# Patient Record
Sex: Female | Born: 1985 | Race: White | Hispanic: No | Marital: Married | Smoking: Never smoker
Health system: Southern US, Community
[De-identification: ages and names within clinical notes are randomized; demographics above are authoritative.]

## PROBLEM LIST (undated history)

## (undated) DIAGNOSIS — F32A Depression, unspecified: Secondary | ICD-10-CM

## (undated) DIAGNOSIS — F329 Major depressive disorder, single episode, unspecified: Secondary | ICD-10-CM

## (undated) DIAGNOSIS — F419 Anxiety disorder, unspecified: Secondary | ICD-10-CM

## (undated) DIAGNOSIS — E079 Disorder of thyroid, unspecified: Secondary | ICD-10-CM

## (undated) DIAGNOSIS — L409 Psoriasis, unspecified: Secondary | ICD-10-CM

## (undated) HISTORY — DX: Anxiety disorder, unspecified: F41.9

## (undated) HISTORY — DX: Disorder of thyroid, unspecified: E07.9

## (undated) HISTORY — DX: Depression, unspecified: F32.A

## (undated) HISTORY — DX: Psoriasis, unspecified: L40.9

## (undated) HISTORY — PX: OTHER SURGICAL HISTORY: SHX169

---
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
## 2014-05-06 DIAGNOSIS — E079 Disorder of thyroid, unspecified: Secondary | ICD-10-CM

## 2014-05-06 HISTORY — DX: Disorder of thyroid, unspecified: E07.9

## 2015-05-11 DIAGNOSIS — H52223 Regular astigmatism, bilateral: Secondary | ICD-10-CM | POA: Diagnosis not present

## 2015-05-11 DIAGNOSIS — H5203 Hypermetropia, bilateral: Secondary | ICD-10-CM | POA: Diagnosis not present

## 2015-05-24 ENCOUNTER — Ambulatory Visit (HOSPITAL_COMMUNITY): Payer: Self-pay | Admitting: Psychiatry

## 2015-05-25 MED FILL — CLOBETASOL 0.05% OINTMENT: 0.05 | 30 days supply | Qty: 60 | Fill #0

## 2015-05-26 ENCOUNTER — Encounter (HOSPITAL_COMMUNITY): Payer: Self-pay | Admitting: Psychiatry

## 2015-05-26 ENCOUNTER — Ambulatory Visit (INDEPENDENT_AMBULATORY_CARE_PROVIDER_SITE_OTHER): Payer: 59 | Admitting: Psychiatry

## 2015-05-26 VITALS — BP 111/72 | HR 72 | Wt 116.0 lb

## 2015-05-26 DIAGNOSIS — F331 Major depressive disorder, recurrent, moderate: Secondary | ICD-10-CM

## 2015-05-26 DIAGNOSIS — O99345 Other mental disorders complicating the puerperium: Secondary | ICD-10-CM

## 2015-05-26 DIAGNOSIS — F53 Postpartum depression: Secondary | ICD-10-CM

## 2015-05-26 DIAGNOSIS — L409 Psoriasis, unspecified: Secondary | ICD-10-CM | POA: Insufficient documentation

## 2015-05-26 MED ORDER — SERTRALINE HCL 50 MG PO TABS: ORAL_TABLET | ORAL | Status: DC

## 2015-05-26 NOTE — Progress Notes (Signed)
Psychiatric Initial Adult Assessment   Patient Identification: Brittany Mcknight MRN:  [id number] Date of Evaluation:  05/26/2015 Referral Source: Primary care/ OBGYN Chief Complaint:   Chief Complaint    Establish Care     Visit Diagnosis:    ICD-9-CM ICD-10-CM   1. Major depressive disorder, recurrent episode, moderate (HCC) 296.32 F33.1 Thyroid Panel With TSH  2. Psoriasis 696.1 L40.9 Thyroid Panel With TSH  3. Postpartum depression 648.44 F53    311     Diagnosis:   Patient Active Problem List   Diagnosis Date Noted  . Psoriasis [L40.9] 05/26/2015   History of Present Illness:  30 years old currently married Caucasian female referred by primary care physician for management of depression.  Patient is currently 5 months postpartum she has seen her OB/GYN doctor  and was started Zoloft a few months ago. For follow-up she was referred to psychiatry. Says that she has been feeling down not interested in things a motivated. She was living at carrier with her parents at that time she was feeling more overwhelmed. Crying spells feeling blah disinterested and not enjoying the things. Says Zoloft has helped some but she still feels down but not having crying spells. She has moved with her husband now living in [facility]. She has to take care of 2 babies. 27 yr old boy and 30 months old daughter.  She also endorses worries, worries about her health. She has psoriasis. She also is worried about her finances and revocation but is trying to adjust. She says that she does not have too many friends away her. Had good bonding with baby. Baby was calm. Toddler was in play room did well with staff.  Aggravating factors; finances. He location from [place]. Postpartum Modifying factors; supportive husband he is now working. Severity of depression; 5 out of 10. 10 being no depression Context; postpartum and finances Duration 6-7 months  Associated Signs/Symptoms: Depression Symptoms:  depressed  mood, anhedonia, fatigue, anxiety, (Hypo) Manic Symptoms:  Distractibility, Anxiety Symptoms:  Excessive Worry, Psychotic Symptoms:  denies PTSD Symptoms: NA   Past Psychiatric History: No prior psychiatric admission or suicide attempt. Says that then she has been depressed when she was younger but never had any regular treatment. Did not endorse post partum 3 years ago with her first baby. Says that she kept humble with her relegion and giving, maybe that indirectly helped.  Denies prior Mania or psychotic episode. Denies prior drug use or alcohol abuse. Past Medical History:  Past Medical History  Diagnosis Date  . Psoriasis    History reviewed. No pertinent past surgical history. Family History:  Family History  Problem Relation Age of Onset  . Depression Father    Social History:   Social History   Social History  . Marital Status: Married    Spouse Name: N/A  . Number of Children: N/A  . Years of Education: N/A   Social History Main Topics  . Smoking status: Never Smoker   . Smokeless tobacco: Never Used  . Alcohol Use: No  . Drug Use: No  . Sexual Activity: Yes   Other Topics Concern  . None   Social History Narrative  . None   Additional Social History: Grew up in [place] and [place]. Follow-up with her parents and 4 siblings. 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 Currently married for the last 5 years supportive husband. 2 babies   Musculoskeletal: Strength & Muscle  Tone: within normal limits Gait & Station: normal Patient leans: N/A  Psychiatric Specialty Exam: HPI  Review of Systems  Constitutional: Negative.   Respiratory: Negative for cough.   Cardiovascular: Negative for chest pain.  Skin: Negative for rash.  Neurological: Negative for tremors.  Psychiatric/Behavioral: Positive for depression. Negative for suicidal ideas and substance abuse.    Blood pressure 111/72, pulse 72, weight 116  lb (52.617 kg).There is no height on file to calculate BMI.  General Appearance: Casual  Eye Contact:  Fair  Speech:  Normal Rate  Volume:  Normal  Mood:  Dysphoric  Affect:  Congruent and Constricted  Thought Process:  Coherent  Orientation:  Full (Time, Place, and Person)  Thought Content:  Rumination  Suicidal Thoughts:  No  Homicidal Thoughts:  No  Memory:  Immediate;   Fair Recent;   Fair  Judgement:  Fair  Insight:  Shallow  Psychomotor Activity:  Normal  Concentration:  Fair  Recall:  [place]: Fair  Akathisia:  Negative  Handed:  Right  AIMS (if indicated):    Assets:  Communication Skills Desire for Improvement Social Support  ADL's:  Intact  Cognition: WNL  Sleep:  fair   Is the patient at risk to self?  No. Has the patient been a risk to self in the past 6 months?  No. Has the patient been a risk to self within the distant past?  No. Is the patient a risk to others?  No. Has the patient been a risk to others in the past 6 months?  No. Has the patient been a risk to others within the distant past?  No.  Allergies:   Allergies  Allergen Reactions  . Imitrex [Sumatriptan]    Current Medications: Current Outpatient Prescriptions  Medication Sig Dispense Refill  . NORA-BE 0.35 MG tablet TK 1 T PO  QD  2  . sertraline (ZOLOFT) 50 MG tablet Take one and half. Total dose of 75mg  45 tablet 0   No current facility-administered medications for this visit.    Previous Psychotropic Medications: No   Substance Abuse History in the last 12 months:  No.  Consequences of Substance Abuse: NA  Medical Decision Making:  Review of Psycho-Social Stressors (1), Review or order clinical lab tests (1), Decision to obtain old records (1) and Review of Medication Regimen & Side Effects (2)  Treatment Plan Summary: Medication management and Plan as follows  MDD: Increase Zoloft to 75 mg discussed the side effects she does have nausea  sometime understands that she has to maybe take it with food or divide the dose Post partum: as above treatment. No psychotic symptoms. Has good bonding with baby Rule out hypothryoidism: says TSH was done in past . Had to repeat it. Will write down for panel. More than 50% time spent in counseling and coordination of care including patient education Call 911 or report local emergency room for any urgent concerns or suicidal thoughts Consider therapy but as of now she wants to continue medication management  Follow-up in 3-4 weeks or earlier if needed  Montavius Subramaniam 1/20/20173:08 PM

## 2015-05-27 LAB — THYROID PANEL WITH TSH
Free Thyroxine Index: 1.6 (ref 1.4–3.8)
T3 Uptake: 34 % (ref 22–35)
T4 TOTAL: 4.6 ug/dL (ref 4.5–12.0)
TSH: 1.749 u[IU]/mL (ref 0.350–4.500)

## 2015-06-05 ENCOUNTER — Encounter: Payer: Self-pay | Admitting: Osteopathic Medicine

## 2015-06-05 ENCOUNTER — Ambulatory Visit (INDEPENDENT_AMBULATORY_CARE_PROVIDER_SITE_OTHER): Payer: 59 | Admitting: Osteopathic Medicine

## 2015-06-05 VITALS — BP 121/53 | HR 59 | Ht 64.0 in | Wt 122.0 lb

## 2015-06-05 DIAGNOSIS — R946 Abnormal results of thyroid function studies: Secondary | ICD-10-CM | POA: Diagnosis not present

## 2015-06-05 DIAGNOSIS — R7989 Other specified abnormal findings of blood chemistry: Secondary | ICD-10-CM

## 2015-06-05 NOTE — Progress Notes (Signed)
HPI: Brittany Mcknight is a 30 y.o. female who presents to [place] today for chief complaint of:  Chief Complaint  Patient presents with  . Establish Care    THYROID CONCERNS     Thyroid: Was told thyroid was "off" by another Dr in [place], [place] at 6 weeks postpartum, no history of thyroid problems. No other problems in her pregnancy. Patient denies symptoms such as hair skin changes, significant fatigue, palpitations, diarrhea or constipation, heat or cold intolerance.  Past medical, social and family history reviewed: Past Medical History  Diagnosis Date  . Psoriasis   . Thyroid disease 2016   History reviewed. No pertinent past surgical history. Social History  Substance Use Topics  . Smoking status: Former Research scientist (life sciences)  . Smokeless tobacco: Never Used     Comment: Patient quit 2005  . Alcohol Use: No   Family History  Problem Relation Age of Onset  . Depression Father   . Cancer Mother     PANCREATIC  . Diabetes Maternal Grandfather   . Stroke Maternal Grandfather   . Diabetes Paternal Grandfather     Current Outpatient Prescriptions  Medication Sig Dispense Refill  . Clobetasol Propionate 0.05 % lotion Apply topically as needed.    Marland Kitchen NORA-BE 0.35 MG tablet TK 1 T PO  QD  2  . sertraline (ZOLOFT) 50 MG tablet Take one and half. Total dose of 75mg  45 tablet 0   No current facility-administered medications for this visit.   Allergies  Allergen Reactions  . Imitrex [Sumatriptan]       Review of Systems: CONSTITUTIONAL:  No  fever, no chills, No  unintentional weight changes HEAD/EYES/EARS/NOSE/THROAT: No  headache, no vision change, no hearing change, No  sore throat, No  sinus pressure CARDIAC: No  chest pain, No  pressure, No palpitations, No  orthopnea RESPIRATORY: No  cough, No  shortness of breath/wheeze GASTROINTESTINAL: No  nausea, No  vomiting, No  abdominal pain, No  blood in stool, No  diarrhea, No  constipation   MUSCULOSKELETAL: No  myalgia/arthralgia GENITOURINARY: No  incontinence, No  abnormal genital bleeding/discharge SKIN: No  rash/wounds/concerning lesions HEM/ONC: No  easy bruising/bleeding, No  abnormal lymph node ENDOCRINE: No polyuria/polydipsia/polyphagia, No  heat/cold intolerance  NEUROLOGIC: No  weakness, No  dizziness, No  slurred speech PSYCHIATRIC: No  concerns with depression, No  concerns with anxiety, No sleep problems  Exam:  BP 121/53 mmHg  Pulse 59  Ht 5\' 4"  (1.626 m)  Wt 122 lb (55.339 kg)  BMI 20.93 kg/m2 Constitutional: VS see above. General Appearance: alert, well-developed, well-nourished, NAD Eyes: Normal lids and conjunctive, non-icteric sclera Ears, Nose, Mouth, Throat: MMM, Normal external inspection ears/nares/mouth/lips/gums,   Neck: No masses, trachea midline. No thyroid enlargement/tenderness/mass appreciated. No lymphadenopathy Respiratory: Normal respiratory effort. no wheeze, no rhonchi, no rales Cardiovascular: S1/S2 normal, no murmur, no rub/gallop auscultated. RRR.  Skin: warm, dry, intact. No rash/ulcer. No concerning nevi or subq nodules on limited exam.   Psychiatric: Normal judgment/insight. Normal mood and affect. Oriented x3.    No results found for this or any previous visit (from the past 72 hour(s)).    ASSESSMENT/PLAN: Patient reports abnormal thyroid blood test however most recent lab draw done by psychiatry shows normal thyroid function, question transient thyroid abnormality postpartum, await labs from previous physicians office. Can recheck in 6 months. Otherwise follow-up for annual wellness exam.   Abnormal thyroid blood test   Return in about 6 months (around 12/03/2015) for  recheck labs.

## 2015-06-05 NOTE — Patient Instructions (Signed)
We will get lab results from previous doctor, otherwise follow up thyroid test in 6 months, please schedule visit sooner if any problems or concerns.

## 2015-06-12 MED FILL — NORETHINDRONE 0.35 MG TAB: 0.35 | 28 days supply | Qty: 28 | Fill #0

## 2015-06-12 MED FILL — SERTRALINE HCL 50 MG TABLET: 50 | 30 days supply | Qty: 30 | Fill #0

## 2015-06-13 DIAGNOSIS — L409 Psoriasis, unspecified: Secondary | ICD-10-CM | POA: Diagnosis not present

## 2015-06-13 MED FILL — CALCIPOTRIENE 0.005% CREAM: 0.005 | 20 days supply | Qty: 60 | Fill #0

## 2015-06-13 MED FILL — CLOBETASOL 0.05% OINTMENT: 0.05 | 10 days supply | Qty: 30 | Fill #0

## 2015-06-14 ENCOUNTER — Ambulatory Visit (HOSPITAL_COMMUNITY): Payer: Self-pay | Admitting: Psychiatry

## 2015-06-22 ENCOUNTER — Encounter (HOSPITAL_COMMUNITY): Payer: Self-pay | Admitting: Psychiatry

## 2015-06-22 ENCOUNTER — Ambulatory Visit (INDEPENDENT_AMBULATORY_CARE_PROVIDER_SITE_OTHER): Payer: 59 | Admitting: Psychiatry

## 2015-06-22 VITALS — BP 122/62 | HR 71 | Ht 64.0 in | Wt 119.0 lb

## 2015-06-22 DIAGNOSIS — L409 Psoriasis, unspecified: Secondary | ICD-10-CM | POA: Diagnosis not present

## 2015-06-22 DIAGNOSIS — O99345 Other mental disorders complicating the puerperium: Secondary | ICD-10-CM

## 2015-06-22 DIAGNOSIS — F53 Postpartum depression: Secondary | ICD-10-CM

## 2015-06-22 DIAGNOSIS — F331 Major depressive disorder, recurrent, moderate: Secondary | ICD-10-CM | POA: Diagnosis not present

## 2015-06-22 NOTE — Progress Notes (Signed)
Patient ID: Brittany Mcknight, female   DOB: Sep 12, 1985, 30 y.o.   MRN: [id number]  [facility] Outpatient Follow up visit  Patient Identification: Brittany Mcknight MRN:  [id number] Date of Evaluation:  06/22/2015 Referral Source: Primary care/ OBGYN Chief Complaint:   Chief Complaint    Follow-up     Visit Diagnosis:    ICD-9-CM ICD-10-CM   1. Major depressive disorder, recurrent episode, moderate (HCC) 296.32 F33.1   2. Postpartum depression 648.44 F53    311    3. Psoriasis 696.1 L40.9    Diagnosis:   Patient Active Problem List   Diagnosis Date Noted  . Psoriasis [L40.9] 05/26/2015   History of Present Illness:  30 years old currently married Caucasian female initally referred by primary care physician for management of depression.  Patient was initially referred because of depression, crying spells she was postpartum months. Last visit we increased Zoloft to 75 mg she's been doing somewhat better Baby was here at good bonding. Her toddler was also here. Finances still a concern since she is not working. They're planning to move to a better more affordable living No nausea reported today tolerating medications.  Aggravating factors; finances. Her location from [place]. Postpartum Modifying factors; supportive husband he is now working. Severity of depression; 6 out of 10. 10 being no depression Context; postpartum and finances Duration 6-7 months  Associated Signs/Symptoms: Depression Symptoms: some dysphoria at times (Hypo) Manic Symptoms:  Distractibility, Anxiety Symptoms:  Excessive Worry, (improving) Psychotic Symptoms:  denies PTSD Symptoms: NA   Past Psychiatric History: No prior psychiatric admission or suicide attempt. Says that then she has been depressed when she was younger but never had any regular treatment.  Past Medical History:  Past Medical History  Diagnosis Date  . Psoriasis   . Thyroid disease 2016   History reviewed. No pertinent past surgical  history. Family History:  Family History  Problem Relation Age of Onset  . Depression Father   . Cancer Mother     PANCREATIC  . Diabetes Maternal Grandfather   . Stroke Maternal Grandfather   . Diabetes Paternal Grandfather    Social History:   Social History   Social History  . Marital Status: Married    Spouse Name: N/A  . Number of Children: N/A  . Years of Education: N/A   Social History Main Topics  . Smoking status: Former Research scientist (life sciences)  . Smokeless tobacco: Never Used     Comment: Patient quit 2005  . Alcohol Use: No  . Drug Use: No  . Sexual Activity: Yes   Other Topics Concern  . None   Social History Narrative      Musculoskeletal: Strength & Muscle Tone: within normal limits Gait & Station: normal Patient leans: N/A  Psychiatric Specialty Exam: HPI  Review of Systems  Constitutional: Negative for fever.  Respiratory: Negative for cough.   Cardiovascular: Negative for chest pain.  Gastrointestinal: Negative for nausea.  Skin: Negative for rash.  Neurological: Negative for tremors.  Psychiatric/Behavioral: Negative for suicidal ideas and substance abuse.    Blood pressure 122/62, pulse 71, height 5\' 4"  (1.626 m), weight 119 lb (53.978 kg), SpO2 98 %.Body mass index is 20.42 kg/(m^2).  General Appearance: Casual  Eye Contact:  Fair  Speech:  Normal Rate  Volume:  Normal  Mood:  Less dysphoric  Affect:  Reactive today  Thought Process:  Coherent  Orientation:  Full (Time, Place, and Person)  Thought Content:  Rumination  Suicidal Thoughts:  No  Homicidal  Thoughts:  No  Memory:  Immediate;   Fair Recent;   Fair  Judgement:  Fair  Insight:  Shallow  Psychomotor Activity:  Normal  Concentration:  Fair  Recall:  [place]: Fair  Akathisia:  Negative  Handed:  Right  AIMS (if indicated):    Assets:  Communication Skills Desire for Improvement Social Support  ADL's:  Intact  Cognition: WNL  Sleep:  fair   Is  the patient at risk to self?  No. Has the patient been a risk to self in the past 6 months?  No. Has the patient been a risk to self within the distant past?  No.   Allergies:   Allergies  Allergen Reactions  . Imitrex [Sumatriptan]    Current Medications: Current Outpatient Prescriptions  Medication Sig Dispense Refill  . Clobetasol Propionate 0.05 % lotion Apply topically as needed.    Marland Kitchen NORA-BE 0.35 MG tablet TK 1 T PO  QD  2  . sertraline (ZOLOFT) 50 MG tablet Take one and half. Total dose of 75mg  45 tablet 0   No current facility-administered medications for this visit.    Previous Psychotropic Medications: No   Substance Abuse History in the last 12 months:  No.  Consequences of Substance Abuse: NA   Treatment Plan Summary: Medication management and Plan as follows  MDD: Continue Zoloft to 75 mg discussed the side effects . No nausea reported today. Does not want to increase dose further. Post partum: as above treatment. No psychotic symptoms. Has good bonding with baby. Depression is improved Thyroid labs reviewed. Normal Insomnia: variable . Not worsened. More than 50% time spent in counseling and coordination of care including patient education Call 911 or report local emergency room for any urgent concerns or suicidal thoughts Consider therapy but as of now she wants to continue medication management Denies alcohol or drug use.  Follow-up in 8 weeks or earlier if needed Time spent: 25 minutes Araceli Coufal 2/16/201710:11 AM

## 2015-07-17 MED FILL — NORETHINDRONE 0.35 MG TAB: 0.35 | 28 days supply | Qty: 28 | Fill #0

## 2015-08-02 DIAGNOSIS — L409 Psoriasis, unspecified: Secondary | ICD-10-CM | POA: Diagnosis not present

## 2015-08-02 MED FILL — FLUOCINONIDE 0.05% SOLUTION: 0.05 | 30 days supply | Qty: 60 | Fill #0

## 2015-08-02 MED FILL — CALCIPOTRIENE 0.005% CREAM: 0.005 | 20 days supply | Qty: 60 | Fill #1

## 2015-08-02 MED FILL — CLOBETASOL 0.05% OINTMENT: 0.05 | 10 days supply | Qty: 30 | Fill #1

## 2015-08-08 ENCOUNTER — Encounter: Payer: Self-pay | Admitting: Osteopathic Medicine

## 2015-08-08 ENCOUNTER — Ambulatory Visit (INDEPENDENT_AMBULATORY_CARE_PROVIDER_SITE_OTHER): Payer: 59 | Admitting: Osteopathic Medicine

## 2015-08-08 VITALS — BP 122/63 | HR 102 | Wt 120.0 lb

## 2015-08-08 DIAGNOSIS — G4700 Insomnia, unspecified: Secondary | ICD-10-CM | POA: Diagnosis not present

## 2015-08-08 DIAGNOSIS — Z3041 Encounter for surveillance of contraceptive pills: Secondary | ICD-10-CM | POA: Diagnosis not present

## 2015-08-08 MED ORDER — NORA-BE 0.35 MG PO TABS: 1.0000 | ORAL_TABLET | Freq: Every day | ORAL | Status: DC

## 2015-08-08 MED ORDER — HYDROXYZINE HCL 50 MG PO TABS: 50.0000 mg | ORAL_TABLET | Freq: Every evening | ORAL | Status: DC | PRN

## 2015-08-08 MED FILL — NORETHINDRONE 0.35 MG TAB: 0.35 | 84 days supply | Qty: 84 | Fill #0

## 2015-08-08 NOTE — Progress Notes (Signed)
HPI: Brittany Mcknight is a 30 y.o. female who presents to [place] today for chief complaint of:  Chief Complaint  Patient presents with  . Contraception     . Patient seeking refill of birth control pills, apparently she passed her pharmacy these could be refilled, I think what happened was her previous physician denied these and says she needed an appointment, she came here for an appointment thinking it was necessary to see me. She has not run out of the birth control pills at this point  . No other problems today other than complaint of insomnia, she is being seen by psychiatry for treatment of anxiety problems. Doing well on Zoloft except some sleep disturbance.   Past medical, social and family history reviewed: Past Medical History  Diagnosis Date  . Psoriasis   . Thyroid disease 2016   No past surgical history on file. Social History  Substance Use Topics  . Smoking status: Former Research scientist (life sciences)  . Smokeless tobacco: Never Used     Comment: Patient quit 2005  . Alcohol Use: No   Family History  Problem Relation Age of Onset  . Depression Father   . Cancer Mother     PANCREATIC  . Diabetes Maternal Grandfather   . Stroke Maternal Grandfather   . Diabetes Paternal Grandfather     Current Outpatient Prescriptions  Medication Sig Dispense Refill  . Clobetasol Propionate 0.05 % lotion Apply topically as needed.    Marland Kitchen NORA-BE 0.35 MG tablet TK 1 T PO  QD  2  . sertraline (ZOLOFT) 50 MG tablet Take one and half. Total dose of 75mg  45 tablet 0   No current facility-administered medications for this visit.   Allergies  Allergen Reactions  . Imitrex [Sumatriptan]       Review of Systems: HEAD/EYES/EARS/NOSE/THROAT: No  headache,  CARDIAC: No  chest pain, GASTROINTESTINAL: No  nausea, No  vomiting, No  abdominal pai PSYCHIATRIC: No  concerns with depression, (+)concerns with anxiety, (+)sleep problems  Exam:  BP 122/63 mmHg  Pulse  102  Wt 120 lb (54.432 kg) Constitutional: VS see above. General Appearance: alert, well-developed, well-nourished, NAD Eyes: Normal lids and conjunctive, non-icteric sclera,  Neck: No masses, trachea midline.  Respiratory: Normal respiratory effort Musculoskeletal: Gait normal.  Psychiatric: Normal judgment/insight. Normal mood and affect. Oriented x3.    ASSESSMENT/PLAN:  Insomnia - Plan: hydrOXYzine (ATARAX/VISTARIL) 50 MG tablet  Encounter for surveillance of contraceptive pills - Plan: NORA-BE 0.35 MG tablet     Return as needed/annual physical.

## 2015-08-11 ENCOUNTER — Ambulatory Visit (HOSPITAL_COMMUNITY): Payer: 59 | Admitting: Psychiatry

## 2015-08-15 ENCOUNTER — Telehealth: Payer: Self-pay

## 2015-08-15 NOTE — Telephone Encounter (Signed)
Difficult to diagnose over the phone, though usually spotting/unusual bleeding is not serious and can happen in women who have been on birth control for some time. Please check that her birth control medication is the same (I know we refilled birth control recently which was initially Rx by another provider elsewhere, didn't make any changes but would double-check anyway that Rx is same). Sometimes abnormal bleeding/spotting can happen even if meds haven't changed. I would want to rule out pregnancy because sometimes early pregnancy can cause bleeding or spotting, and if bleeding persists we need to do a vaginal exam in the office to make sure no polyps or other visible anatomic abnormality and possible check some labs.

## 2015-08-16 ENCOUNTER — Ambulatory Visit (INDEPENDENT_AMBULATORY_CARE_PROVIDER_SITE_OTHER): Payer: 59 | Admitting: Osteopathic Medicine

## 2015-08-16 ENCOUNTER — Encounter: Payer: Self-pay | Admitting: Osteopathic Medicine

## 2015-08-16 VITALS — BP 110/72 | HR 80 | Wt 121.0 lb

## 2015-08-16 DIAGNOSIS — N926 Irregular menstruation, unspecified: Secondary | ICD-10-CM | POA: Diagnosis not present

## 2015-08-16 LAB — POCT URINE PREGNANCY: Preg Test, Ur: NEGATIVE

## 2015-08-16 MED ORDER — ESTRADIOL 1 MG PO TABS: 2.0000 mg | ORAL_TABLET | Freq: Every day | ORAL | Status: DC

## 2015-08-16 MED FILL — ESTRADIOL 1 MG TABLET: 1 | 7 days supply | Qty: 14 | Fill #0

## 2015-08-16 NOTE — Telephone Encounter (Signed)
Patient advised of results and recommendations.  

## 2015-08-16 NOTE — Progress Notes (Signed)
HPI: Brittany Mcknight is a 30 y.o. female who presents to [place] today for chief complaint of:  Chief Complaint  Patient presents with  . abnormal period    started period yesterday after having been on it the week before    . Quality: vaginal bleeding off schedule . Duration: 1 day . Timing: last period stopped 08/08/15, this one started yesterday 08/15/15 . Context: recently refilled birth  Control - same as what she was taking prior . Assoc signs/symptoms: some cramping, no passing clots or unusual discharge   Past medical, social and family history reviewed: Past Medical History  Diagnosis Date  . Psoriasis   . Thyroid disease 2016   No past surgical history on file. Social History  Substance Use Topics  . Smoking status: Former Research scientist (life sciences)  . Smokeless tobacco: Never Used     Comment: Patient quit 2005  . Alcohol Use: No   Family History  Problem Relation Age of Onset  . Depression Father   . Cancer Mother     PANCREATIC  . Diabetes Maternal Grandfather   . Stroke Maternal Grandfather   . Diabetes Paternal Grandfather     Current Outpatient Prescriptions  Medication Sig Dispense Refill  . Clobetasol Propionate 0.05 % lotion Apply topically as needed.    . hydrOXYzine (ATARAX/VISTARIL) 50 MG tablet Take 1 tablet (50 mg total) by mouth at bedtime as needed (SLEEP). 30 tablet 1  . NORA-BE 0.35 MG tablet Take 1 tablet (0.35 mg total) by mouth daily. 3 Package 4  . sertraline (ZOLOFT) 50 MG tablet Take one and half. Total dose of 75mg  45 tablet 0   No current facility-administered medications for this visit.   Allergies  Allergen Reactions  . Imitrex [Sumatriptan]       Review of Systems: CONSTITUTIONAL:  No  fever, no chills, No  unintentional weight changes GASTROINTESTINAL: No  nausea, No  vomiting, No  abdominal pain GENITOURINARY: No  incontinence, (+) abnormal genital bleeding as per hpi, no abnormal discharge SKIN: No   rash/wounds/concerning lesions HEM/ONC: No  easy bruising/bleeding,  Exam:  BP 110/72 mmHg  Pulse 80  Wt 121 lb (54.885 kg) Constitutional: VS see above. General Appearance: alert, well-developed, well-nourished, NAD Psychiatric: Normal judgment/insight. Normal mood and affect. Oriented x3.    No results found for this or any previous visit (from the past 72 hour(s)).    ASSESSMENT/PLAN: Trial estrogen burst to stop bleeding, advised can continue OCP or consider switching to other pill or other method but abnormal bleeding is not uncommon on OCP. ER/RTC precautions reviewed. Would consider further eval w/ vaginal exam, [place], labs or refer to GYN if no improvement  Irregular periods/menstrual cycles - Plan: POCT urine pregnancy, estradiol (ESTRACE) 1 MG tablet   Return if symptoms worsen or fail to improve.

## 2015-08-16 NOTE — Patient Instructions (Signed)
Irregular bleeding patterns are common in people on birth control and are usually not a cause for concern as long as the bleeding stops within a weeks or two, as long as pregnancy is ruled out and the bleeding is not profuse and there is no serious pain. Short course of estrogen will usually stop the bleeding if needed and many women do not have problems. Please let us know if your bleeding persists or worsens, or if you have any other concerns. We may need to do a vaginal exam and further labs/testing if needed.

## 2015-08-16 NOTE — Progress Notes (Signed)
Results for orders placed or performed in visit on 08/16/15 (from the past 24 hour(s))  POCT urine pregnancy     Status: Abnormal   Collection Time: 08/16/15  2:52 PM  Result Value Ref Range   Preg Test, Ur Negative Negative

## 2015-08-17 MED FILL — SERTRALINE HCL 50 MG TABLET: 50 | 30 days supply | Qty: 30 | Fill #1

## 2015-08-30 ENCOUNTER — Encounter (HOSPITAL_COMMUNITY): Payer: Self-pay | Admitting: Psychiatry

## 2015-08-30 ENCOUNTER — Ambulatory Visit (INDEPENDENT_AMBULATORY_CARE_PROVIDER_SITE_OTHER): Payer: 59 | Admitting: Psychiatry

## 2015-08-30 VITALS — BP 102/64 | HR 60 | Ht 64.0 in | Wt 123.0 lb

## 2015-08-30 DIAGNOSIS — F331 Major depressive disorder, recurrent, moderate: Secondary | ICD-10-CM | POA: Diagnosis not present

## 2015-08-30 DIAGNOSIS — F53 Postpartum depression: Secondary | ICD-10-CM

## 2015-08-30 DIAGNOSIS — O99345 Other mental disorders complicating the puerperium: Secondary | ICD-10-CM

## 2015-08-30 MED ORDER — SERTRALINE HCL 50 MG PO TABS: ORAL_TABLET | ORAL | Status: DC

## 2015-08-30 NOTE — Progress Notes (Signed)
Patient ID: Brittany Mcknight, female   DOB: 1985-12-24, 30 y.o.   MRN: [id number]  [facility] Outpatient Follow up visit  Patient Identification: Brittany Mcknight MRN:  [id number] Date of Evaluation:  08/30/2015 Referral Source: Primary care/ OBGYN Chief Complaint:   Chief Complaint    Follow-up     Visit Diagnosis:    ICD-9-CM ICD-10-CM   1. Major depressive disorder, recurrent episode, moderate (HCC) 296.32 F33.1   2. Postpartum depression 648.44 F53    311     Diagnosis:   Patient Active Problem List   Diagnosis Date Noted  . Psoriasis [L40.9] 05/26/2015   History of Present Illness:  30 years old currently married Caucasian female initally referred by primary care physician for management of depression.  Patient was initially referred because of depression, crying spells she was postpartum months. Last visit we kept zoloft at 75 mg as she was doing better in her depression.  Baby was here at good bonding. Her toddler here as well. Finances still a concern since she is not working.  No nausea reported today tolerating medications.  Aggravating factors; finances. Her location from [place]. Postpartum Modifying factors; supportive husband he is now working. Severity of depression; 7  out of 10. 10 being no depression Context; postpartum and finances Duration 8 months  Associated Signs/Symptoms: Depression Symptoms: some dysphoria at times( not worsened) (Hypo) Manic Symptoms:  Distractibility, Anxiety Symptoms:  Excessive Worry, (improving) Psychotic Symptoms:  denies PTSD Symptoms: NA    Past Medical History:  Past Medical History  Diagnosis Date  . Psoriasis   . Thyroid disease 2016   History reviewed. No pertinent past surgical history. Family History:  Family History  Problem Relation Age of Onset  . Depression Father   . Cancer Mother     PANCREATIC  . Diabetes Maternal Grandfather   . Stroke Maternal Grandfather   . Diabetes Paternal Grandfather    Social  History:   Social History   Social History  . Marital Status: Married    Spouse Name: N/A  . Number of Children: N/A  . Years of Education: N/A   Social History Main Topics  . Smoking status: Former Research scientist (life sciences)  . Smokeless tobacco: Never Used     Comment: Patient quit 2005  . Alcohol Use: No  . Drug Use: No  . Sexual Activity: Yes   Other Topics Concern  . None   Social History Narrative      Musculoskeletal: Strength & Muscle Tone: within normal limits Gait & Station: normal Patient leans: N/A  Psychiatric Specialty Exam: HPI  Review of Systems  Constitutional: Negative for fever.  Respiratory: Negative for cough.   Cardiovascular: Negative for chest pain.  Gastrointestinal: Negative for nausea.  Skin: Negative for rash.  Neurological: Negative for tremors.  Psychiatric/Behavioral: Negative for depression, suicidal ideas and substance abuse.    Blood pressure 102/64, pulse 60, height 5\' 4"  (1.626 m), weight 123 lb (55.792 kg), SpO2 99 %.Body mass index is 21.1 kg/(m^2).  General Appearance: Casual  Eye Contact:  Fair  Speech:  Normal Rate  Volume:  Normal  Mood:  euthymic  Affect:  Reactive  Thought Process:  Coherent  Orientation:  Full (Time, Place, and Person)  Thought Content:  normal  Suicidal Thoughts:  No  Homicidal Thoughts:  No  Memory:  Immediate;   Fair Recent;   Fair  Judgement:  Fair  Insight:  Shallow  Psychomotor Activity:  Normal  Concentration:  Fair  Recall:  [place]: Fair  Akathisia:  Negative  Handed:  Right  AIMS (if indicated):    Assets:  Communication Skills Desire for Improvement Social Support  ADL's:  Intact  Cognition: WNL  Sleep:  fair   Is the patient at risk to self?  No. Has the patient been a risk to self in the past 6 months?  No. Has the patient been a risk to self within the distant past?  No.   Allergies:   Allergies  Allergen Reactions  . Imitrex [Sumatriptan]    Current  Medications: Current Outpatient Prescriptions  Medication Sig Dispense Refill  . Clobetasol Propionate 0.05 % lotion Apply topically as needed.    Marland Kitchen estradiol (ESTRACE) 1 MG tablet Take 2 tablets (2 mg total) by mouth daily. For 7 days 14 tablet 0  . hydrOXYzine (ATARAX/VISTARIL) 50 MG tablet Take 1 tablet (50 mg total) by mouth at bedtime as needed (SLEEP). 30 tablet 1  . NORA-BE 0.35 MG tablet Take 1 tablet (0.35 mg total) by mouth daily. 3 Package 4  . sertraline (ZOLOFT) 50 MG tablet Take one and half. Total dose of 75mg  45 tablet 2   No current facility-administered medications for this visit.    Previous Psychotropic Medications: No   Substance Abuse History in the last 12 months:  No.    Treatment Plan Summary: Medication management and Plan as follows  MDD: Continue Zoloft to 75 mg discussed the side effects . Refills sent.  Does not want to increase dose further. Post partum: as above treatment. No psychotic symptoms. Has good bonding with baby. Depression has improved  Insomnia: variable . Not worsened. Takes vistaril prn.  More than 50% time spent in counseling and coordination of care including patient education Call 911 or report local emergency room for any urgent concerns or suicidal thoughts Consider therapy but as of now she wants to continue medication management Denies alcohol or drug use.  Follow-up in 3 months or earlier if needed. Time spent: 25 minutes Matthewjames Petrasek 4/26/201710:26 AM

## 2015-09-04 MED FILL — CLOBETASOL 0.05% OINTMENT: 0.05 | 15 days supply | Qty: 30 | Fill #0

## 2015-10-03 MED FILL — CLOBETASOL 0.05% OINTMENT: 0.05 | 15 days supply | Qty: 30 | Fill #1

## 2015-10-30 MED FILL — CLOBETASOL 0.05% OINTMENT: 0.05 | 15 days supply | Qty: 30 | Fill #2

## 2015-11-06 MED FILL — NORETHINDRONE 0.35 MG TAB: 0.35 | 84 days supply | Qty: 84 | Fill #1

## 2015-11-20 ENCOUNTER — Ambulatory Visit (HOSPITAL_COMMUNITY): Payer: 59 | Admitting: Psychiatry

## 2015-11-21 ENCOUNTER — Encounter (HOSPITAL_COMMUNITY): Payer: Self-pay | Admitting: Psychiatry

## 2015-11-21 ENCOUNTER — Ambulatory Visit (INDEPENDENT_AMBULATORY_CARE_PROVIDER_SITE_OTHER): Payer: 59 | Admitting: Psychiatry

## 2015-11-21 VITALS — BP 116/62 | HR 69 | Ht 64.0 in | Wt 123.0 lb

## 2015-11-21 DIAGNOSIS — F53 Postpartum depression: Secondary | ICD-10-CM

## 2015-11-21 DIAGNOSIS — F331 Major depressive disorder, recurrent, moderate: Secondary | ICD-10-CM

## 2015-11-21 DIAGNOSIS — O99345 Other mental disorders complicating the puerperium: Secondary | ICD-10-CM

## 2015-11-21 NOTE — Progress Notes (Signed)
Patient ID: Brittany Mcknight, female   DOB: Oct 02, 1985, 30 y.o.   MRN: [id number]  [facility] Outpatient Follow up visit  Patient Identification: Brittany Mcknight MRN:  [id number] Date of Evaluation:  11/21/2015 Referral Source: Primary care/ OBGYN Chief Complaint:   Chief Complaint    Follow-up     Visit Diagnosis:    ICD-9-CM ICD-10-CM   1. Major depressive disorder, recurrent episode, moderate (HCC) 296.32 F33.1   2. Postpartum depression 648.44 F53    311     Diagnosis:   Patient Active Problem List   Diagnosis Date Noted  . Psoriasis [L40.9] 05/26/2015   History of Present Illness:  30 years old currently married Caucasian female initally referred by primary care physician for management of depression.  Patient was initially referred because of depression, crying spells she was postpartum months. Last visit we kept zoloft at 75 mg as she was doing better in her depression.   She continues to do great good bonding the babies were here. She feels she is not taking the medication regularly and she has not suffered from depression and she is ready to stop. She has family in [place] and she visits them at also keeps her  Randel Books was here at good bonding. Finances still a concern since she is not working.  No nausea reported today tolerating medications.  Aggravating factors; finances. Her location from [place]. Postpartum Modifying factors; supportive husband he is now working. Severity of depression; 7  out of 10. 10 being no depression Context; postpartum and finances Duration 8 months  Associated Signs/Symptoms: Depression Symptoms: some dysphoria at times( not worsened) (Hypo) Manic Symptoms:  Distractibility, Anxiety Symptoms:  Excessive Worry, (improving) Psychotic Symptoms:  denies PTSD Symptoms: NA    Past Medical History:  Past Medical History  Diagnosis Date  . Psoriasis   . Thyroid disease 2016   History reviewed. No pertinent past surgical history. Family  History:  Family History  Problem Relation Age of Onset  . Depression Father   . Cancer Mother     PANCREATIC  . Diabetes Maternal Grandfather   . Stroke Maternal Grandfather   . Diabetes Paternal Grandfather    Social History:   Social History   Social History  . Marital Status: Married    Spouse Name: N/A  . Number of Children: N/A  . Years of Education: N/A   Social History Main Topics  . Smoking status: Former Research scientist (life sciences)  . Smokeless tobacco: Never Used     Comment: Patient quit 2005  . Alcohol Use: No  . Drug Use: No  . Sexual Activity: Yes   Other Topics Concern  . None   Social History Narrative      Musculoskeletal: Strength & Muscle Tone: within normal limits Gait & Station: normal Patient leans: N/A  Psychiatric Specialty Exam: HPI  Review of Systems  Constitutional: Negative for fever.  Respiratory: Negative for cough.   Cardiovascular: Negative for chest pain.  Gastrointestinal: Negative for nausea.  Skin: Negative for rash.  Neurological: Negative for tremors.  Psychiatric/Behavioral: Negative for depression, suicidal ideas and substance abuse. The patient is not nervous/anxious.     Blood pressure 116/62, pulse 69, height 5\' 4"  (1.626 m), weight 123 lb (55.792 kg), SpO2 96 %.Body mass index is 21.1 kg/(m^2).  General Appearance: Casual  Eye Contact:  Fair  Speech:  Normal Rate  Volume:  Normal  Mood:  euthymic  Affect:  Reactive  Thought Process:  Coherent  Orientation:  Full (Time,  Place, and Person)  Thought Content:  normal  Suicidal Thoughts:  No  Homicidal Thoughts:  No  Memory:  Immediate;   Fair Recent;   Fair  Judgement:  Fair  Insight:  Shallow  Psychomotor Activity:  Normal  Concentration:  Fair  Recall:  [place]: Fair  Akathisia:  Negative  Handed:  Right  AIMS (if indicated):    Assets:  Communication Skills Desire for Improvement Social Support  ADL's:  Intact  Cognition: WNL  Sleep:   fair   Is the patient at risk to self?  No. Has the patient been a risk to self in the past 6 months?  No. Has the patient been a risk to self within the distant past?  No.   Allergies:   Allergies  Allergen Reactions  . Imitrex [Sumatriptan]    Current Medications: Current Outpatient Prescriptions  Medication Sig Dispense Refill  . Clobetasol Propionate 0.05 % lotion Apply topically as needed.    Marland Kitchen estradiol (ESTRACE) 1 MG tablet Take 2 tablets (2 mg total) by mouth daily. For 7 days 14 tablet 0  . NORA-BE 0.35 MG tablet Take 1 tablet (0.35 mg total) by mouth daily. 3 Package 4  . sertraline (ZOLOFT) 50 MG tablet Take one and half. Total dose of 75mg  45 tablet 2   No current facility-administered medications for this visit.    Previous Psychotropic Medications: No   Substance Abuse History in the last 12 months:  No.    Treatment Plan Summary: Medication management and Plan as follows  MDD: taper down zoloft to 50mg  for one week then 25mg  one week and then stop. Support is good. Will assess how she is doing without meds in 1-2 months.  Post partum: improved  Insomnia: variable . Not worsened. Takes vistaril prn.  More than 50% time spent in counseling and coordination of care including patient education Call 911 or report local emergency room for any urgent concerns or suicidal thoughts Consider therapy but as of now she wants to continue medication management Denies alcohol or drug use.  Follow-up in 2 months or earlier if needed. Time spent: 25 minutes Ketan Renz 7/18/201710:19 AM

## 2015-12-19 MED FILL — CLOBETASOL 0.05% OINTMENT: 0.05 | 15 days supply | Qty: 30 | Fill #3

## 2016-01-16 ENCOUNTER — Encounter (HOSPITAL_COMMUNITY): Payer: Self-pay | Admitting: Psychiatry

## 2016-01-16 ENCOUNTER — Ambulatory Visit (INDEPENDENT_AMBULATORY_CARE_PROVIDER_SITE_OTHER): Payer: 59 | Admitting: Psychiatry

## 2016-01-16 VITALS — BP 112/68 | HR 61 | Resp 16 | Ht 64.0 in | Wt 124.4 lb

## 2016-01-16 DIAGNOSIS — L409 Psoriasis, unspecified: Secondary | ICD-10-CM

## 2016-01-16 DIAGNOSIS — F53 Postpartum depression: Secondary | ICD-10-CM

## 2016-01-16 DIAGNOSIS — F331 Major depressive disorder, recurrent, moderate: Secondary | ICD-10-CM

## 2016-01-16 DIAGNOSIS — O99345 Other mental disorders complicating the puerperium: Secondary | ICD-10-CM

## 2016-01-16 NOTE — Progress Notes (Signed)
Patient ID: Brittany Mcknight, female   DOB: 16-Nov-1985, 30 y.o.   MRN: [id number]  [facility] Outpatient Follow up visit  Patient Identification: Brittany Mcknight MRN:  [id number] Date of Evaluation:  01/16/2016 Referral Source: Primary care/ OBGYN Chief Complaint:   Chief Complaint    Follow-up     Visit Diagnosis:    ICD-9-CM ICD-10-CM   1. Major depressive disorder, recurrent episode, moderate (HCC) 296.32 F33.1   2. Postpartum depression 648.44 F53    311    3. Psoriasis 696.1 L40.9    Diagnosis:   Patient Active Problem List   Diagnosis Date Noted  . Psoriasis [L40.9] 05/26/2015   History of Present Illness:  30 years old currently married Caucasian female initally referred by primary care physician for management of depression.  Patient was initially referred because of depression, crying spells she was postpartum months. Last visit She decided she wanted to taper off so she is now off of medication for the last 2 weeks  She continues to do great good bonding the babies were here. She was his family in [place]. She is doing reasonable does not feel that she needs to get back on medication. Baby was here at good bonding. Finances still a concern since she is not working.  No nausea reported today tolerating medications.  Aggravating factors; finances. Her location from [place]. Postpartum Modifying factors; supportive husband he is now working. Severity of depression; 7  out of 10. 10 being no depression Context; postpartum and finances Duration 8 months  Associated Signs/Symptoms: Depression Symptoms: some dysphoria at times( not worsened) (Hypo) Manic Symptoms:  Distractibility, Anxiety Symptoms:  Excessive Worry, (improving) Psychotic Symptoms:  denies PTSD Symptoms: NA    Past Medical History:  Past Medical History:  Diagnosis Date  . Psoriasis   . Thyroid disease 2016   History reviewed. No pertinent surgical history. Family History:  Family History  Problem  Relation Age of Onset  . Depression Father   . Cancer Mother     PANCREATIC  . Diabetes Maternal Grandfather   . Stroke Maternal Grandfather   . Diabetes Paternal Grandfather    Social History:   Social History   Social History  . Marital status: Married    Spouse name: N/A  . Number of children: N/A  . Years of education: N/A   Social History Main Topics  . Smoking status: Former Research scientist (life sciences)  . Smokeless tobacco: Never Used     Comment: Patient quit 2005  . Alcohol use No  . Drug use: No  . Sexual activity: Yes   Other Topics Concern  . None   Social History Narrative  . None      Musculoskeletal: Strength & Muscle Tone: within normal limits Gait & Station: normal Patient leans: N/A  Psychiatric Specialty Exam: HPI  Review of Systems  Constitutional: Negative for fever.  Respiratory: Negative for cough.   Cardiovascular: Negative for palpitations.  Gastrointestinal: Negative for nausea.  Skin: Negative for rash.  Neurological: Negative for tremors.  Psychiatric/Behavioral: Negative for depression and substance abuse. The patient is not nervous/anxious.     Blood pressure 112/68, pulse 61, resp. rate 16, height 5\' 4"  (1.626 m), weight 124 lb 6.4 oz (56.4 kg), SpO2 99 %.Body mass index is 21.35 kg/m.  General Appearance: Casual  Eye Contact:  Fair  Speech:  Normal Rate  Volume:  Normal  Mood:  euthymic  Affect:  Reactive  Thought Process:  Coherent  Orientation:  Full (Time, Place,  and Person)  Thought Content:  normal  Suicidal Thoughts:  No  Homicidal Thoughts:  No  Memory:  Immediate;   Fair Recent;   Fair  Judgement:  Fair  Insight:  Shallow  Psychomotor Activity:  Normal  Concentration:  Fair  Recall:  [place]: Fair  Akathisia:  Negative  Handed:  Right  AIMS (if indicated):    Assets:  Communication Skills Desire for Improvement Social Support  ADL's:  Intact  Cognition: WNL  Sleep:  fair   Is the patient  at risk to self?  No. Has the patient been a risk to self in the past 6 months?  No. Has the patient been a risk to self within the distant past?  No.   Allergies:   Allergies  Allergen Reactions  . Imitrex [Sumatriptan]    Current Medications: Current Outpatient Prescriptions  Medication Sig Dispense Refill  . Clobetasol Propionate 0.05 % lotion Apply topically as needed.    Marland Kitchen estradiol (ESTRACE) 1 MG tablet Take 2 tablets (2 mg total) by mouth daily. For 7 days 14 tablet 0  . NORA-BE 0.35 MG tablet Take 1 tablet (0.35 mg total) by mouth daily. 3 Package 4   No current facility-administered medications for this visit.     Previous Psychotropic Medications: No   Substance Abuse History in the last 12 months:  No.    Treatment Plan Summary: Medication management and Plan as follows  MDD: remission. Will stop zoloft. Support is good. Will assess how she is doing without meds in 2-3 months.  Post partum: improved  Insomnia: variable . Not worsened. Takes vistaril prn.  More than 50% time spent in counseling and coordination of care including patient education Call 911 or report local emergency room for any urgent concerns or suicidal thoughts Consider therapy but as of now she wants to continue medication management Denies alcohol or drug use.  Follow-up in 2 months or earlier if needed. Time spent: 25 minutes Brittany Mcknight 9/12/201710:04 AM

## 2016-01-25 MED FILL — CLOBETASOL 0.05% OINTMENT: 0.05 | 15 days supply | Qty: 30 | Fill #4

## 2016-01-30 MED FILL — NORETHINDRONE 0.35 MG TAB: 0.35 | 84 days supply | Qty: 84 | Fill #2

## 2016-02-08 ENCOUNTER — Encounter: Payer: Self-pay | Admitting: Sports Medicine

## 2016-02-08 ENCOUNTER — Ambulatory Visit (INDEPENDENT_AMBULATORY_CARE_PROVIDER_SITE_OTHER): Payer: 59 | Admitting: Sports Medicine

## 2016-02-08 ENCOUNTER — Ambulatory Visit (INDEPENDENT_AMBULATORY_CARE_PROVIDER_SITE_OTHER): Payer: 59

## 2016-02-08 DIAGNOSIS — M222X2 Patellofemoral disorders, left knee: Secondary | ICD-10-CM

## 2016-02-08 DIAGNOSIS — M25562 Pain in left knee: Secondary | ICD-10-CM | POA: Diagnosis not present

## 2016-02-08 DIAGNOSIS — M222X1 Patellofemoral disorders, right knee: Secondary | ICD-10-CM

## 2016-02-08 DIAGNOSIS — M25461 Effusion, right knee: Secondary | ICD-10-CM | POA: Diagnosis not present

## 2016-02-08 MED ORDER — MELOXICAM 15 MG PO TABS: ORAL_TABLET | ORAL | 3 refills | Status: DC

## 2016-02-08 MED FILL — MELOXICAM 15 MG TABLET: 15 | 30 days supply | Qty: 30 | Fill #0

## 2016-02-08 NOTE — Progress Notes (Signed)
   Subjective:    I'm seeing this patient as a consultation for:   Dr. Emeterio Reeve  CC: Bilateral knee pain  HPI: This is a pleasant 30 year old female with a history of psoriasis comes in with a long history of pain in both knees, anterior, worse going up and down stairs, squatting, running, she runs approximately 20-30 miles per week unfortunately her pain has limited her mileage. No mechanical symptoms, no swelling, no gelling.  Past medical history:  Negative.  See flowsheet/record as well for more information.  Surgical history: Negative.  See flowsheet/record as well for more information.  Family history: Negative.  See flowsheet/record as well for more information.  Social history: Negative.  See flowsheet/record as well for more information.  Allergies, and medications have been entered into the medical record, reviewed, and no changes needed.   Review of Systems: No headache, visual changes, nausea, vomiting, diarrhea, constipation, dizziness, abdominal pain, skin rash, fevers, chills, night sweats, weight loss, swollen lymph nodes, body aches, joint swelling, muscle aches, chest pain, shortness of breath, mood changes, visual or auditory hallucinations.   Objective:   General: Well Developed, well nourished, and in no acute distress.  Neuro/Psych: Alert and oriented x3, extra-ocular muscles intact, able to move all 4 extremities, sensation grossly intact. Skin: Warm and dry, no rashes noted.  Respiratory: Not using accessory muscles, speaking in full sentences, trachea midline.  Cardiovascular: Pulses palpable, no extremity edema. Abdomen: Does not appear distended. Bilateral knees: Normal to inspection with no erythema or effusion or obvious bony abnormalities. Palpation normal with no warmth or joint line tenderness or patellar tenderness or condyle tenderness. ROM normal in flexion and extension and lower leg rotation. Ligaments with solid consistent endpoints  including ACL, PCL, LCL, MCL. Negative Mcmurray's and provocative meniscal tests. Non painful patellar compression. Patellar and quadriceps tendons unremarkable. Hamstring and quadriceps strength is normal. Hip abductor strength is weak bilaterally.  Impression and Recommendations:   This case required medical decision making of moderate complexity.  Patellofemoral pain syndrome of both knees Symptoms are classic for patellofemoral pain syndrome. Vastus medialis and hip abductor rehabilitation exercises given. Switching to meloxicam. This does not sound classic for psoriatic arthritis, we are going to get some baseline x-rays. Return in one month recheck hip strength.

## 2016-02-08 NOTE — Patient Instructions (Signed)
Hip Rehabilitation Protocol:  1.  Side leg raises.  3x30 with no weight, then 3x15 with 2 lb ankle weight, then 3x15 with 5 lb ankle weight 2.  Standing hip rotation.  3x30 with no weight, then 3x15 with 2 lb ankle weight, then 3x15 with 5 lb ankle weight. 3.  Side step ups.  3x30 with no weight, then 3x15 with 5 lbs in backpack, then 3x15 with 10 lbs in backpack. 

## 2016-02-08 NOTE — Assessment & Plan Note (Signed)
Symptoms are classic for patellofemoral pain syndrome. Vastus medialis and hip abductor rehabilitation exercises given. Switching to meloxicam. This does not sound classic for psoriatic arthritis, we are going to get some baseline x-rays. Return in one month recheck hip strength.

## 2016-02-22 MED FILL — CLOBETASOL 0.05% OINTMENT: 0.05 | 15 days supply | Qty: 30 | Fill #5

## 2016-03-01 DIAGNOSIS — Z23 Encounter for immunization: Secondary | ICD-10-CM | POA: Diagnosis not present

## 2016-03-01 DIAGNOSIS — L409 Psoriasis, unspecified: Secondary | ICD-10-CM | POA: Diagnosis not present

## 2016-03-01 DIAGNOSIS — D225 Melanocytic nevi of trunk: Secondary | ICD-10-CM | POA: Diagnosis not present

## 2016-03-12 ENCOUNTER — Encounter: Payer: Self-pay | Admitting: Sports Medicine

## 2016-03-12 ENCOUNTER — Ambulatory Visit (INDEPENDENT_AMBULATORY_CARE_PROVIDER_SITE_OTHER): Payer: 59 | Admitting: Sports Medicine

## 2016-03-12 DIAGNOSIS — M222X2 Patellofemoral disorders, left knee: Secondary | ICD-10-CM

## 2016-03-12 DIAGNOSIS — M222X1 Patellofemoral disorders, right knee: Secondary | ICD-10-CM

## 2016-03-12 NOTE — Assessment & Plan Note (Signed)
Resolved with appropriate rehabilitation including vastus medialis and hip abductor rehabilitation. Hip strength is now excellent, and her knee pain has not surprisingly resolved. Return as needed

## 2016-03-12 NOTE — Progress Notes (Signed)
  Subjective:    CC: Follow-up  HPI: Patellofemoral syndrome: Resolved with rehabilitation of the hip abductors and vastus medialis.  Past medical history:  Negative.  See flowsheet/record as well for more information.  Surgical history: Negative.  See flowsheet/record as well for more information.  Family history: Negative.  See flowsheet/record as well for more information.  Social history: Negative.  See flowsheet/record as well for more information.  Allergies, and medications have been entered into the medical record, reviewed, and no changes needed.   Review of Systems: No fevers, chills, night sweats, weight loss, chest pain, or shortness of breath.   Objective:    General: Well Developed, well nourished, and in no acute distress.  Neuro: Alert and oriented x3, extra-ocular muscles intact, sensation grossly intact.  HEENT: Normocephalic, atraumatic, pupils equal round reactive to light, neck supple, no masses, no lymphadenopathy, thyroid nonpalpable.  Skin: Warm and dry, no rashes. Cardiac: Regular rate and rhythm, no murmurs rubs or gallops, no lower extremity edema.  Respiratory: Clear to auscultation bilaterally. Not using accessory muscles, speaking in full sentences. Hips: Good strength to abduction bilaterally.  Impression and Recommendations:    Patellofemoral pain syndrome of both knees Resolved with appropriate rehabilitation including vastus medialis and hip abductor rehabilitation. Hip strength is now excellent, and her knee pain has not surprisingly resolved. Return as needed

## 2016-03-19 ENCOUNTER — Telehealth: Payer: Self-pay

## 2016-03-19 NOTE — Telephone Encounter (Signed)
Pt left VM stating she's still having severe knee pain and feels it will give way. Would like to know what she should do at this point.

## 2016-03-20 NOTE — Telephone Encounter (Signed)
It was resolved when I saw her last.  Difficult to know what structure in the knee is affected this time without laying hands on the knee.

## 2016-03-22 NOTE — Telephone Encounter (Signed)
Notified. 

## 2016-04-02 MED FILL — CLOBETASOL 0.05% OINTMENT: 0.05 | 15 days supply | Qty: 30 | Fill #0

## 2016-04-09 ENCOUNTER — Ambulatory Visit (INDEPENDENT_AMBULATORY_CARE_PROVIDER_SITE_OTHER): Payer: 59 | Admitting: Psychiatry

## 2016-04-09 ENCOUNTER — Encounter (HOSPITAL_COMMUNITY): Payer: Self-pay | Admitting: Psychiatry

## 2016-04-09 VITALS — BP 112/68 | HR 69 | Resp 16 | Ht 64.0 in | Wt 122.0 lb

## 2016-04-09 DIAGNOSIS — L409 Psoriasis, unspecified: Secondary | ICD-10-CM

## 2016-04-09 DIAGNOSIS — F331 Major depressive disorder, recurrent, moderate: Secondary | ICD-10-CM | POA: Diagnosis not present

## 2016-04-09 DIAGNOSIS — F53 Postpartum depression: Secondary | ICD-10-CM

## 2016-04-09 DIAGNOSIS — Z808 Family history of malignant neoplasm of other organs or systems: Secondary | ICD-10-CM

## 2016-04-09 DIAGNOSIS — Z87891 Personal history of nicotine dependence: Secondary | ICD-10-CM

## 2016-04-09 DIAGNOSIS — Z833 Family history of diabetes mellitus: Secondary | ICD-10-CM | POA: Diagnosis not present

## 2016-04-09 DIAGNOSIS — O99345 Other mental disorders complicating the puerperium: Secondary | ICD-10-CM

## 2016-04-09 DIAGNOSIS — Z818 Family history of other mental and behavioral disorders: Secondary | ICD-10-CM

## 2016-04-09 DIAGNOSIS — Z823 Family history of stroke: Secondary | ICD-10-CM

## 2016-04-09 NOTE — Progress Notes (Signed)
Patient ID: Brittany Mcknight, female   DOB: 02-07-1986, 30 y.o.   MRN: [id number]  [facility] Outpatient Follow up visit  Patient Identification: Brittany Mcknight MRN:  [id number] Date of Evaluation:  04/09/2016 Referral Source: Primary care/ OBGYN Chief Complaint:   Chief Complaint    Follow-up     Visit Diagnosis:    ICD-9-CM ICD-10-CM   1. Major depressive disorder, recurrent episode, moderate (HCC) 296.32 F33.1   2. Postpartum depression 648.44 F53    311    3. Psoriasis 696.1 L40.9    Diagnosis:   Patient Active Problem List   Diagnosis Date Noted  . Patellofemoral pain syndrome of both knees [M22.2X1, M22.2X2] 02/08/2016  . Psoriasis [L40.9] 05/26/2015   History of Present Illness:  30 years old currently married Caucasian female initally referred by primary care physician for management of depression.  Patient is here with her baby continues to well regarding her depression there is no reported concerns. Aggravating factors having finances and also being postpartum Severity of depression as 8 out of 10. 10 being no depression Currently she's not on any medications does not want to be on it and she does reasonable Has family support    Past Medical History:  Past Medical History:  Diagnosis Date  . Psoriasis   . Thyroid disease 2016   History reviewed. No pertinent surgical history. Family History:  Family History  Problem Relation Age of Onset  . Depression Father   . Cancer Mother     PANCREATIC  . Diabetes Maternal Grandfather   . Stroke Maternal Grandfather   . Diabetes Paternal Grandfather    Social History:   Social History   Social History  . Marital status: Married    Spouse name: N/A  . Number of children: N/A  . Years of education: N/A   Social History Main Topics  . Smoking status: Former Research scientist (life sciences)  . Smokeless tobacco: Never Used     Comment: Patient quit 2005  . Alcohol use No  . Drug use: No  . Sexual activity: Yes    Partners: Male   Other  Topics Concern  . None   Social History Narrative  . None       Psychiatric Specialty Exam: HPI  Review of Systems  Constitutional: Negative for fever.  Respiratory: Negative for cough.   Cardiovascular: Negative for chest pain.  Gastrointestinal: Negative for nausea.  Skin: Negative for rash.  Neurological: Negative for tremors.  Psychiatric/Behavioral: Negative for depression and suicidal ideas.    Blood pressure 112/68, pulse 69, resp. rate 16, height 5\' 4"  (1.626 m), weight 122 lb (55.3 kg), SpO2 97 %.Body mass index is 20.94 kg/m.  General Appearance: Casual  Eye Contact:  Fair  Speech:  Normal Rate  Volume:  Normal  Mood:  euthmic  Affect:  congruent  Thought Process:  Coherent  Orientation:  Full (Time, Place, and Person)  Thought Content:  normal  Suicidal Thoughts:  No  Homicidal Thoughts:  No  Memory:  Immediate;   Fair Recent;   Fair  Judgement:  Fair  Insight:  Shallow  Psychomotor Activity:  Normal  Concentration:  Fair  Recall:  [place]: Fair  Akathisia:  Negative  Handed:  Right  AIMS (if indicated):    Assets:  Communication Skills Desire for Improvement Social Support  ADL's:  Intact  Cognition: WNL  Sleep:  fair   Is the patient at risk to self?  No. Has the  patient been a risk to self in the past 6 months?  No. Has the patient been a risk to self within the distant past?  No.   Allergies:   Allergies  Allergen Reactions  . Imitrex [Sumatriptan]    Current Medications: Current Outpatient Prescriptions  Medication Sig Dispense Refill  . Clobetasol Propionate 0.05 % lotion Apply topically as needed.    Marland Kitchen estradiol (ESTRACE) 1 MG tablet Take 2 tablets (2 mg total) by mouth daily. For 7 days 14 tablet 0  . meloxicam (MOBIC) 15 MG tablet One tab PO qAM with breakfast for 2 weeks, then daily prn pain. 30 tablet 3  . NORA-BE 0.35 MG tablet Take 1 tablet (0.35 mg total) by mouth daily. 3 Package 4   No  current facility-administered medications for this visit.     Previous Psychotropic Medications: No   Substance Abuse History in the last 12 months:  No.    Treatment Plan Summary: Medication management and Plan as follows  MDD: in remission. No need for zoloft Post partum: continues to do well  insomnia: sleeps fairly  FU in 4 months or earlier if needed Azzam Mehra 12/5/20179:51 AM

## 2016-04-16 MED FILL — CLOBETASOL 0.05% OINTMENT: 0.05 | 15 days supply | Qty: 30 | Fill #1 | Status: TO

## 2016-04-16 MED FILL — NORETHINDRONE 0.35 MG TAB: 0.35 | 84 days supply | Qty: 84 | Fill #3

## 2016-04-22 ENCOUNTER — Encounter: Payer: Self-pay | Admitting: Osteopathic Medicine

## 2016-04-22 ENCOUNTER — Ambulatory Visit (INDEPENDENT_AMBULATORY_CARE_PROVIDER_SITE_OTHER): Payer: 59 | Admitting: Osteopathic Medicine

## 2016-04-22 VITALS — BP 118/67 | HR 66 | Wt 122.0 lb

## 2016-04-22 DIAGNOSIS — J069 Acute upper respiratory infection, unspecified: Secondary | ICD-10-CM

## 2016-04-22 DIAGNOSIS — B9789 Other viral agents as the cause of diseases classified elsewhere: Secondary | ICD-10-CM | POA: Diagnosis not present

## 2016-04-22 MED ORDER — IPRATROPIUM BROMIDE 0.03 % NA SOLN: 2.0000 | Freq: Three times a day (TID) | NASAL | 0 refills | Status: DC

## 2016-04-22 MED ORDER — BENZONATATE 200 MG PO CAPS: 200.0000 mg | ORAL_CAPSULE | Freq: Three times a day (TID) | ORAL | 0 refills | Status: DC | PRN

## 2016-04-22 MED FILL — IPRATROPIUM 0.03% SPRAY: 0.03 | 30 days supply | Qty: 30 | Fill #0

## 2016-04-22 MED FILL — BENZONATATE 200 MG CAPSULE: 200 | 10 days supply | Qty: 30 | Fill #0

## 2016-04-22 NOTE — Progress Notes (Signed)
HPI: Brittany Mcknight is a 30 y.o. female  who presents to [place] today, 04/22/16,  for chief complaint of:  Chief Complaint  Patient presents with  . Cough    X4 DAYS     . Context: sister recently had baby, she would like to see them but is feeling sick  . Quality: Throat hurting, coughing  . Duration: 2-3 days . Assoc signs/symptoms: no fever/chills, +sinus congestion   Past medical, surgical, social and family history reviewed: Patient Active Problem List   Diagnosis Date Noted  . Patellofemoral pain syndrome of both knees 02/08/2016  . Psoriasis 05/26/2015   No past surgical history on file. Social History  Substance Use Topics  . Smoking status: Former Research scientist (life sciences)  . Smokeless tobacco: Never Used     Comment: Patient quit 2005  . Alcohol use No   Family History  Problem Relation Age of Onset  . Depression Father   . Cancer Mother     PANCREATIC  . Diabetes Maternal Grandfather   . Stroke Maternal Grandfather   . Diabetes Paternal Grandfather      Current medication list and allergy/intolerance information reviewed:   Current Outpatient Prescriptions on File Prior to Visit  Medication Sig Dispense Refill  . Clobetasol Propionate 0.05 % lotion Apply topically as needed.    Marland Kitchen estradiol (ESTRACE) 1 MG tablet Take 2 tablets (2 mg total) by mouth daily. For 7 days 14 tablet 0  . meloxicam (MOBIC) 15 MG tablet One tab PO qAM with breakfast for 2 weeks, then daily prn pain. 30 tablet 3  . NORA-BE 0.35 MG tablet Take 1 tablet (0.35 mg total) by mouth daily. 3 Package 4  . [DISCONTINUED] sertraline (ZOLOFT) 50 MG tablet Take one and half. Total dose of 75mg  45 tablet 2   No current facility-administered medications on file prior to visit.    Allergies  Allergen Reactions  . Imitrex [Sumatriptan]       Review of Systems:  Constitutional: +recent illness  HEENT: +headache, no vision change  Cardiac: No  chest pain, No   pressure, No palpitations  Respiratory:  No  shortness of breath. +Cough  Gastrointestinal: No  abdominal pain, no change on bowel habits  Musculoskeletal: No new myalgia/arthralgia  Skin: No  Rash  Neurologic: No  weakness, No  Dizziness   Exam:  BP 118/67   Pulse 66   Wt 122 lb (55.3 kg)   BMI 20.94 kg/m   Constitutional: VS see above. General Appearance: alert, well-developed, well-nourished, NAD  Eyes: Normal lids and conjunctive, non-icteric sclera  Ears, Nose, Mouth, Throat: MMM, Normal external inspection ears/nares/mouth/lips/gums. Normal nasal mucosa, No pharyngeal erythema/exudate  Neck: No masses, trachea midline. No lymphadenopathy  Respiratory: Normal respiratory effort. no wheeze, no rhonchi, no rales  Cardiovascular: S1/S2 normal, no murmur, no rub/gallop auscultated. RRR.   Musculoskeletal: Gait normal. Symmetric and independent movement of all extremities  Neurological: Normal balance/coordination. No tremor.  Skin: warm, dry, intact.   Psychiatric: Normal judgment/insight. Normal mood and affect. Oriented x3.      ASSESSMENT/PLAN:   Viral URI with cough - Plan: benzonatate (TESSALON) 200 MG capsule, ipratropium (ATROVENT) 0.03 % nasal spray    Patient Instructions  Note: the following list assumes no pregnancy, normal liver & kidney function and no other drug interactions. Dr. Sheppard Coil has highlighted medications which are safe for you to use, but these may not be appropriate for everyone. Always ask a pharmacist or qualified medical  provider if there are any questions!    Aches/Pains, Fever Acetaminophen (Tylenol) 500 mg tablets - take max 2 tablets (1000 mg) every 6 hours (4 times per day)  Ibuprofen (Motrin) 200 mg tablets - take max 4 tablets (800 mg) every 6 hours  Sinus Congestion Prescription Atrovent Cromolyn Nasal Spray (NasalCrom) 1 spray each nostril 3-4 times per day, max 6 imes per day Nasal Saline if desired Oxymetolazone  (Afrin, others) sparing use due to rebound congestion Phenylephrine (Sudafed) 10 mg tablets every 4 hours (or the 12-hour formulation) Diphenhydramine (Benadryl) 25 mg tablets - take max 2 tablets every 4 hours  Cough & Sore Throat Prescription cough pills or syrups Dextromethorphan (Robitussin, others) - cough suppressant Guaifenesin (Robitussin, Mucinex, others) - expectorant (helps cough up mucus) (Dextromethorphan and Guaifenesin also come in a combination tablet) Lozenges w/ Benzocaine + Menthol (Cepacol) Honey - as much as you want! Teas which "coat the throat" - look for ingredients Elm Bark, Licorice Root, Marshmallow Root  Other Zinc Lozenges within 24 hours of symptoms onset - mixed evidence this shortens the duration of the common cold Don't waste your money on Vitamin C or Echinacea       Visit summary with medication list and pertinent instructions was printed for patient to review. All questions at time of visit were answered - patient instructed to contact office with any additional concerns. ER/RTC precautions were reviewed with the patient. Follow-up plan: Return if symptoms worsen or fail to improve.

## 2016-04-22 NOTE — Patient Instructions (Signed)

## 2016-05-03 ENCOUNTER — Ambulatory Visit (INDEPENDENT_AMBULATORY_CARE_PROVIDER_SITE_OTHER): Payer: 59 | Admitting: Osteopathic Medicine

## 2016-05-03 ENCOUNTER — Encounter: Payer: Self-pay | Admitting: Osteopathic Medicine

## 2016-05-03 VITALS — BP 119/70 | HR 72 | Wt 123.0 lb

## 2016-05-03 DIAGNOSIS — R05 Cough: Secondary | ICD-10-CM

## 2016-05-03 NOTE — Progress Notes (Signed)
HPI: Brittany Mcknight is a 30 y.o. female  who presents to [place] today, 05/03/16,  for chief complaint of:  Chief Complaint  Patient presents with  . Follow-up    cough    Appointment made mistakenly - pt is here today because she got a call she had an appointment. Decided to come in. Seen last week for URI, better overall but still some cough, mild sore throat.   Past medical, surgical, social and family history reviewed: Patient Active Problem List   Diagnosis Date Noted  . Patellofemoral pain syndrome of both knees 02/08/2016  . Psoriasis 05/26/2015   No past surgical history on file. Social History  Substance Use Topics  . Smoking status: Former Research scientist (life sciences)  . Smokeless tobacco: Never Used     Comment: Patient quit 2005  . Alcohol use No   Family History  Problem Relation Age of Onset  . Depression Father   . Cancer Mother     PANCREATIC  . Diabetes Maternal Grandfather   . Stroke Maternal Grandfather   . Diabetes Paternal Grandfather      Current medication list and allergy/intolerance information reviewed:   Current Outpatient Prescriptions on File Prior to Visit  Medication Sig Dispense Refill  . benzonatate (TESSALON) 200 MG capsule Take 1 capsule (200 mg total) by mouth 3 (three) times daily as needed for cough. 30 capsule 0  . Clobetasol Propionate 0.05 % lotion Apply topically as needed.    Marland Kitchen estradiol (ESTRACE) 1 MG tablet Take 2 tablets (2 mg total) by mouth daily. For 7 days 14 tablet 0  . ipratropium (ATROVENT) 0.03 % nasal spray Place 2 sprays into both nostrils 3 (three) times daily. 30 mL 0  . meloxicam (MOBIC) 15 MG tablet One tab PO qAM with breakfast for 2 weeks, then daily prn pain. 30 tablet 3  . NORA-BE 0.35 MG tablet Take 1 tablet (0.35 mg total) by mouth daily. 3 Package 4  . [DISCONTINUED] sertraline (ZOLOFT) 50 MG tablet Take one and half. Total dose of 75mg  45 tablet 2   No current facility-administered  medications on file prior to visit.    Allergies  Allergen Reactions  . Imitrex [Sumatriptan]       Review of Systems:  Constitutional: +recent illness  Cardiac: No  chest pain  Respiratory:  No  shortness of breath. +Cough  Exam:  BP 119/70   Pulse 72   Wt 123 lb (55.8 kg)   BMI 21.11 kg/m   Constitutional: VS see above. General Appearance: alert, well-developed, well-nourished, NAD  Ears, Nose, Mouth, Throat: MMM, Normal external inspection ears/nares/mouth/lips/gums. Normal pharynx, no lymphadenopathy  Neck: No masses, trachea midline.   Respiratory: Normal respiratory effort. no wheeze, no rhonchi, no rales  Cardiovascular: S1/S2 normal, no murmur, no rub/gallop auscultated. RRR.   Skin: warm, dry, intact.    ASSESSMENT/PLAN:   Post-viral cough syndrome - Continue supportive care. Advise flu and Tdap when feeling better.     Will not charge for today's visit since appt made in error and pt would not have come otherwise   All questions at time of visit were answered - patient instructed to contact office with any additional concerns. ER/RTC precautions were reviewed with the patient. Follow-up plan: Return if symptoms worsen or fail to improve.

## 2016-05-29 MED FILL — CLOBETASOL 0.05% OINTMENT: 0.05 | 30 days supply | Qty: 60 | Fill #0 | Status: TO

## 2016-05-30 ENCOUNTER — Ambulatory Visit (INDEPENDENT_AMBULATORY_CARE_PROVIDER_SITE_OTHER): Payer: 59 | Admitting: Osteopathic Medicine

## 2016-05-30 VITALS — BP 112/66 | HR 67 | Temp 98.1°F

## 2016-05-30 DIAGNOSIS — H52223 Regular astigmatism, bilateral: Secondary | ICD-10-CM | POA: Diagnosis not present

## 2016-05-30 DIAGNOSIS — H5203 Hypermetropia, bilateral: Secondary | ICD-10-CM | POA: Diagnosis not present

## 2016-05-30 DIAGNOSIS — Z23 Encounter for immunization: Secondary | ICD-10-CM | POA: Diagnosis not present

## 2016-05-30 NOTE — Progress Notes (Signed)
Patient came into clinic today for flu vaccination.Patient tolerated injection of flu immunization in left deltoid well, with no immediate complications.Advised to contact our office with any questions/concerns.

## 2016-07-09 MED FILL — NORETHINDRONE 0.35 MG TAB: 0.35 | 84 days supply | Qty: 84 | Fill #4

## 2016-07-09 MED FILL — CLOBETASOL 0.05% OINTMENT: 0.05 | 15 days supply | Qty: 30 | Fill #0

## 2016-07-31 ENCOUNTER — Encounter (HOSPITAL_COMMUNITY): Payer: Self-pay | Admitting: Psychiatry

## 2016-08-06 ENCOUNTER — Ambulatory Visit (HOSPITAL_COMMUNITY): Payer: 59 | Admitting: Psychiatry

## 2018-01-21 DIAGNOSIS — Z3490 Encounter for supervision of normal pregnancy, unspecified, unspecified trimester: Secondary | ICD-10-CM | POA: Insufficient documentation

## 2018-01-22 DIAGNOSIS — Z8659 Personal history of other mental and behavioral disorders: Secondary | ICD-10-CM

## 2018-01-22 DIAGNOSIS — Z1332 Encounter for screening for maternal depression: Secondary | ICD-10-CM | POA: Diagnosis not present

## 2018-01-22 DIAGNOSIS — Z3481 Encounter for supervision of other normal pregnancy, first trimester: Secondary | ICD-10-CM | POA: Diagnosis not present

## 2018-01-22 HISTORY — DX: Personal history of other mental and behavioral disorders: Z86.59

## 2018-01-26 IMAGING — DX DG KNEE COMPLETE 4+V*R*
4 series · 5 of 5 positions shown · non-contrast
Comparison: None in PACs

CLINICAL DATA: Six months of bilateral knee pain. Patient runs for
exercise.

EXAM:
RIGHT KNEE - COMPLETE 4+ VIEW

[Series 3: knee lat · 0.14mm/px · 2 of 2 slices shown]
[im 1/2]
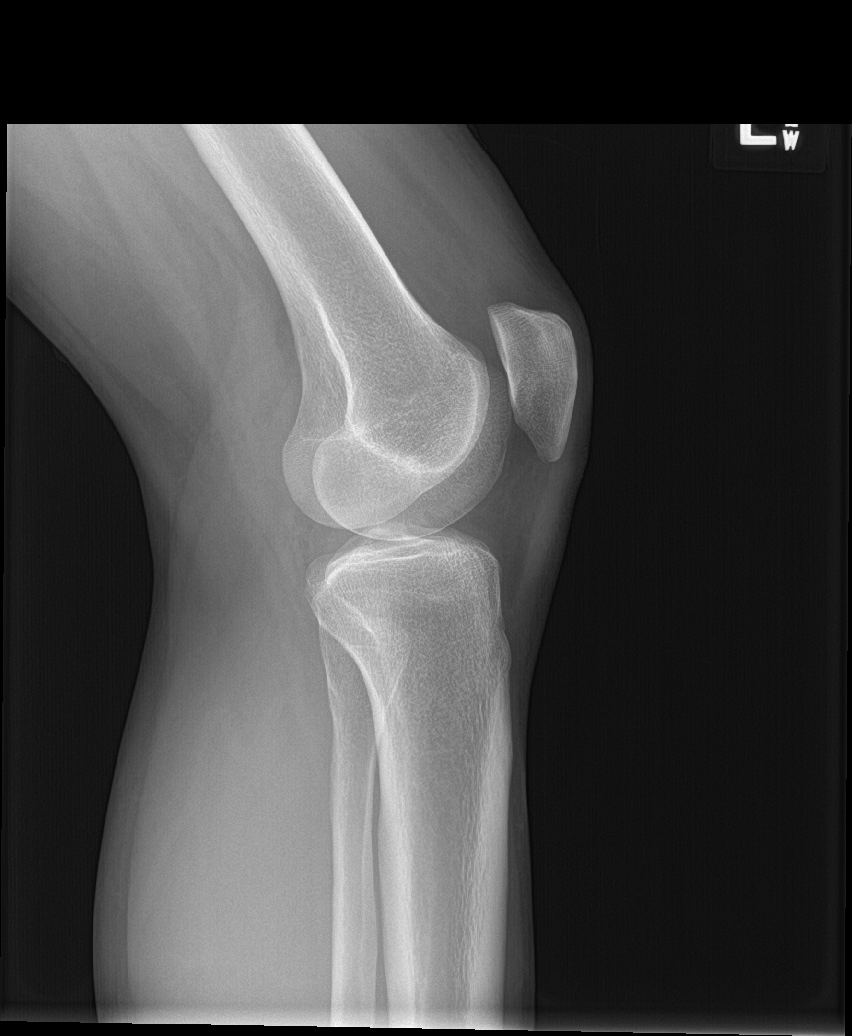
[im 2/2]
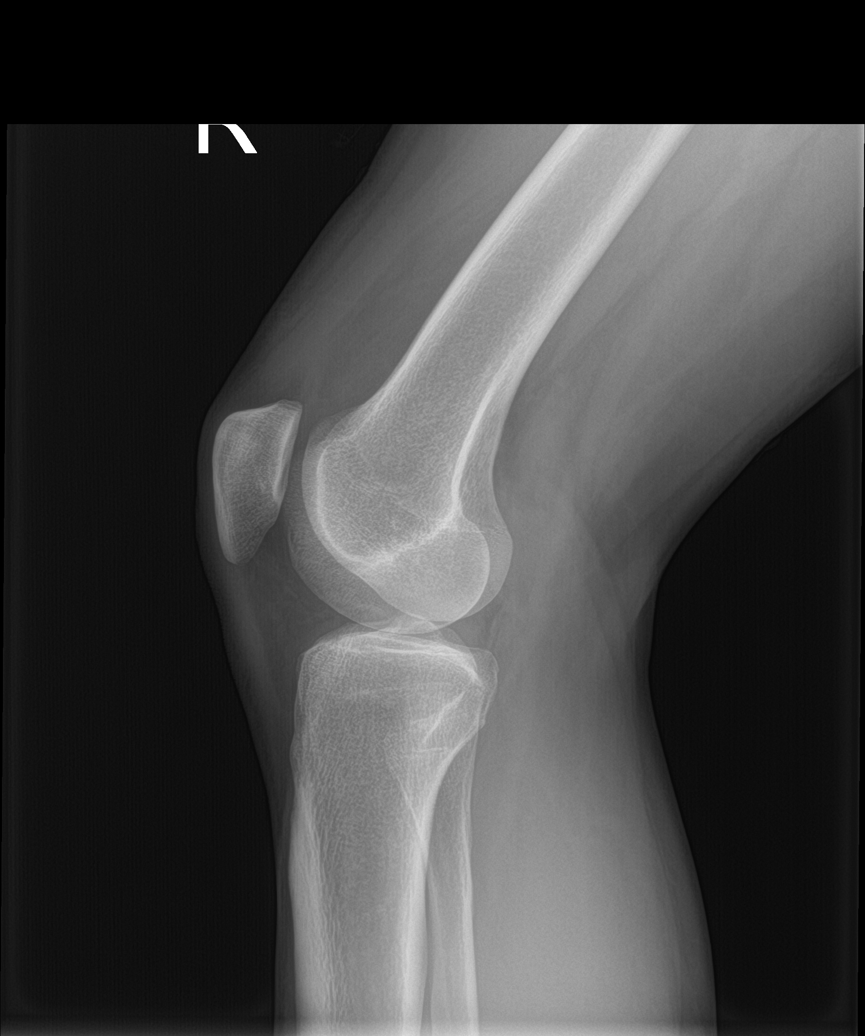

[knee sunrise]
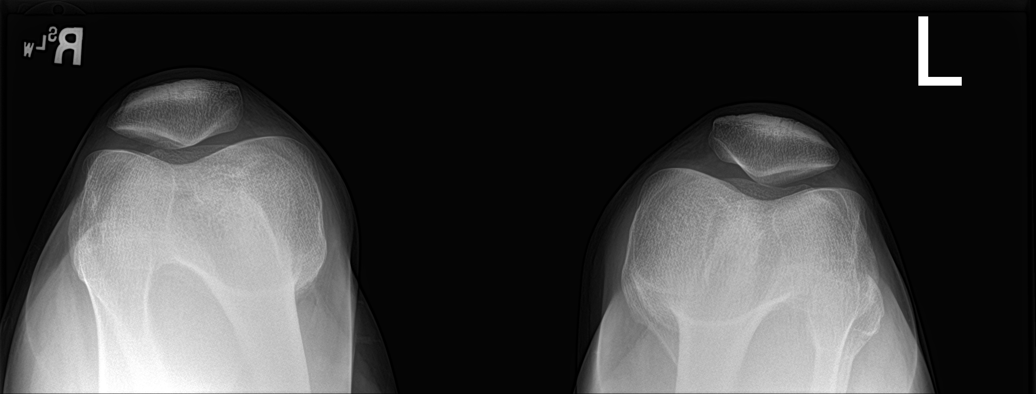

[knee ap bilat standing (1 of 2)]
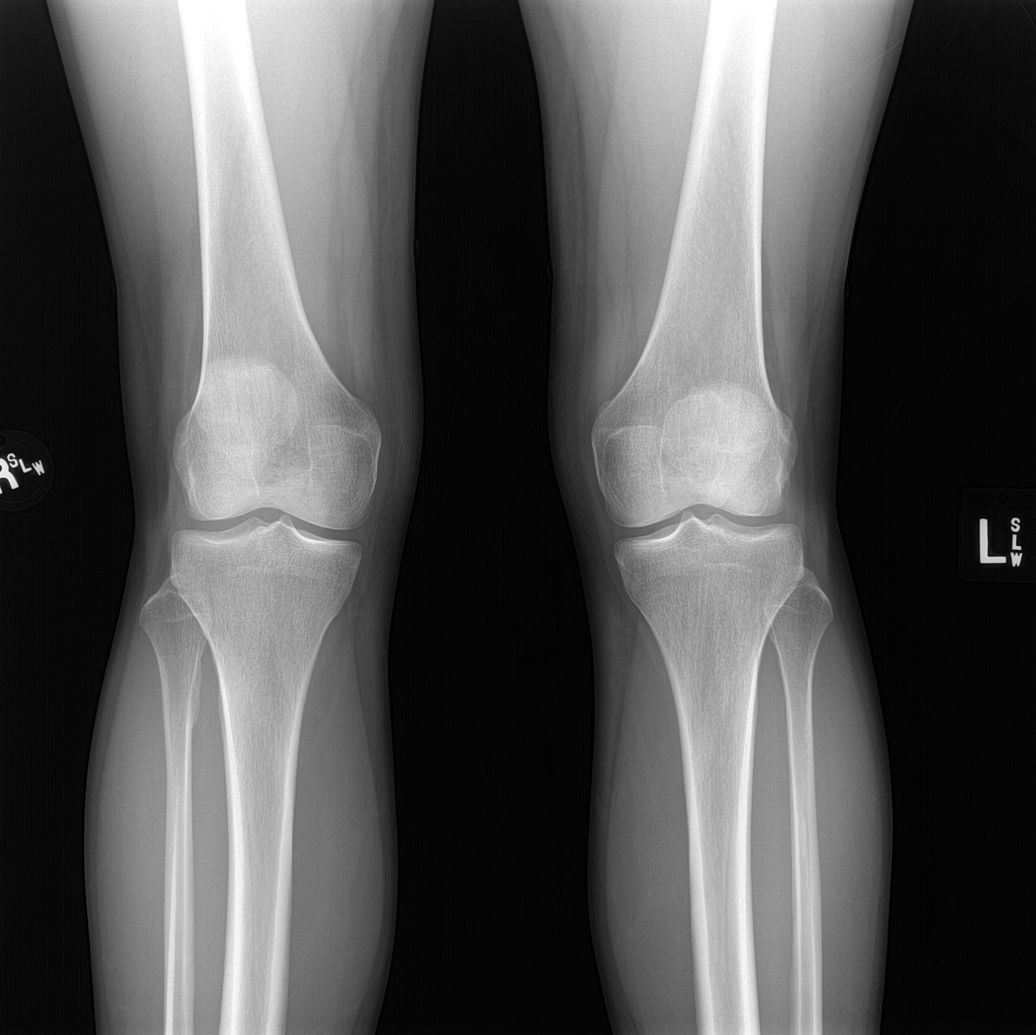

[knee ap bilat standing (2 of 2)]
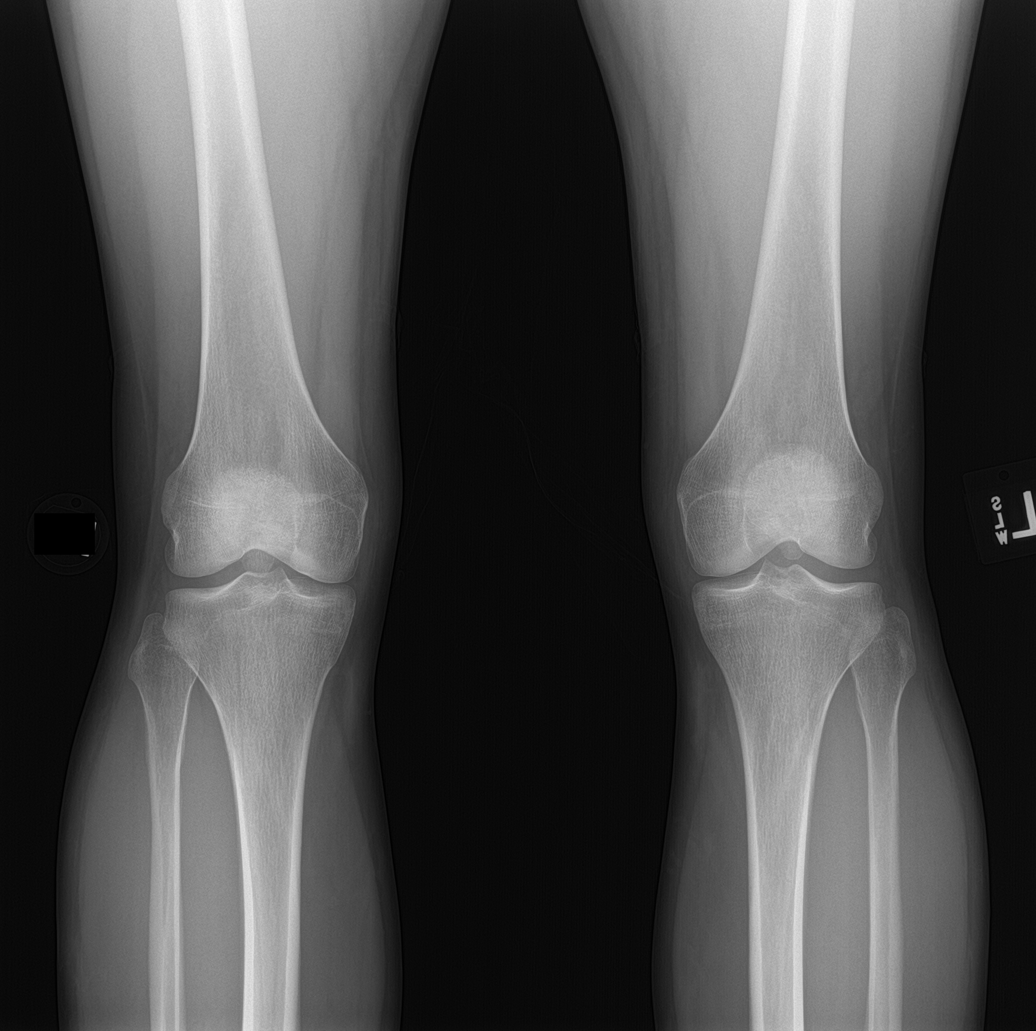

[5 of 5 positions shown; findings below may reference images not displayed]

FINDINGS: The bones are subjectively adequately mineralized. There is no acute
or healing fracture. There is a small suprapatellar effusion. The
joint spaces are preserved.
IMPRESSION: Small suprapatellar effusion. No acute or significant chronic bony
abnormality or joint space narrowing.

## 2018-02-17 DIAGNOSIS — Z3481 Encounter for supervision of other normal pregnancy, first trimester: Secondary | ICD-10-CM | POA: Diagnosis not present

## 2018-02-17 DIAGNOSIS — Z23 Encounter for immunization: Secondary | ICD-10-CM | POA: Diagnosis not present

## 2018-03-31 DIAGNOSIS — Z363 Encounter for antenatal screening for malformations: Secondary | ICD-10-CM | POA: Diagnosis not present

## 2018-03-31 DIAGNOSIS — Z3482 Encounter for supervision of other normal pregnancy, second trimester: Secondary | ICD-10-CM | POA: Diagnosis not present

## 2018-04-14 DIAGNOSIS — Z3482 Encounter for supervision of other normal pregnancy, second trimester: Secondary | ICD-10-CM | POA: Diagnosis not present

## 2018-06-02 DIAGNOSIS — F419 Anxiety disorder, unspecified: Secondary | ICD-10-CM | POA: Insufficient documentation

## 2018-06-02 DIAGNOSIS — Z3480 Encounter for supervision of other normal pregnancy, unspecified trimester: Secondary | ICD-10-CM | POA: Diagnosis not present

## 2018-06-02 DIAGNOSIS — G43009 Migraine without aura, not intractable, without status migrainosus: Secondary | ICD-10-CM | POA: Insufficient documentation

## 2018-06-04 DIAGNOSIS — Z3480 Encounter for supervision of other normal pregnancy, unspecified trimester: Secondary | ICD-10-CM | POA: Diagnosis not present

## 2018-06-10 DIAGNOSIS — Z3480 Encounter for supervision of other normal pregnancy, unspecified trimester: Secondary | ICD-10-CM | POA: Diagnosis not present

## 2018-06-10 DIAGNOSIS — Z23 Encounter for immunization: Secondary | ICD-10-CM | POA: Diagnosis not present

## 2018-06-10 DIAGNOSIS — Z124 Encounter for screening for malignant neoplasm of cervix: Secondary | ICD-10-CM | POA: Diagnosis not present

## 2018-06-25 DIAGNOSIS — Z362 Encounter for other antenatal screening follow-up: Secondary | ICD-10-CM | POA: Diagnosis not present

## 2018-08-08 DIAGNOSIS — O99344 Other mental disorders complicating childbirth: Secondary | ICD-10-CM | POA: Diagnosis not present

## 2018-08-08 DIAGNOSIS — Z8659 Personal history of other mental and behavioral disorders: Secondary | ICD-10-CM | POA: Diagnosis not present

## 2018-08-08 DIAGNOSIS — Z3483 Encounter for supervision of other normal pregnancy, third trimester: Secondary | ICD-10-CM | POA: Diagnosis not present

## 2018-08-08 DIAGNOSIS — Z3A38 38 weeks gestation of pregnancy: Secondary | ICD-10-CM | POA: Diagnosis not present

## 2018-08-08 DIAGNOSIS — G43001 Migraine without aura, not intractable, with status migrainosus: Secondary | ICD-10-CM | POA: Diagnosis not present

## 2018-08-08 DIAGNOSIS — O99824 Streptococcus B carrier state complicating childbirth: Secondary | ICD-10-CM | POA: Diagnosis not present

## 2018-08-08 DIAGNOSIS — Z8759 Personal history of other complications of pregnancy, childbirth and the puerperium: Secondary | ICD-10-CM | POA: Diagnosis not present

## 2018-08-08 DIAGNOSIS — F329 Major depressive disorder, single episode, unspecified: Secondary | ICD-10-CM | POA: Diagnosis not present

## 2018-08-09 DIAGNOSIS — Z8659 Personal history of other mental and behavioral disorders: Secondary | ICD-10-CM | POA: Diagnosis not present

## 2018-08-09 DIAGNOSIS — Z3A38 38 weeks gestation of pregnancy: Secondary | ICD-10-CM | POA: Diagnosis not present

## 2018-08-09 DIAGNOSIS — Z8759 Personal history of other complications of pregnancy, childbirth and the puerperium: Secondary | ICD-10-CM | POA: Diagnosis not present

## 2018-08-09 DIAGNOSIS — G43001 Migraine without aura, not intractable, with status migrainosus: Secondary | ICD-10-CM | POA: Diagnosis not present

## 2018-08-09 DIAGNOSIS — O99824 Streptococcus B carrier state complicating childbirth: Secondary | ICD-10-CM | POA: Diagnosis not present

## 2018-09-18 DIAGNOSIS — Z124 Encounter for screening for malignant neoplasm of cervix: Secondary | ICD-10-CM | POA: Diagnosis not present

## 2018-09-18 DIAGNOSIS — Z3009 Encounter for other general counseling and advice on contraception: Secondary | ICD-10-CM | POA: Diagnosis not present

## 2018-09-18 DIAGNOSIS — O9989 Other specified diseases and conditions complicating pregnancy, childbirth and the puerperium: Secondary | ICD-10-CM | POA: Diagnosis not present

## 2018-11-05 DIAGNOSIS — F419 Anxiety disorder, unspecified: Secondary | ICD-10-CM | POA: Diagnosis not present

## 2018-11-26 DIAGNOSIS — F419 Anxiety disorder, unspecified: Secondary | ICD-10-CM | POA: Diagnosis not present

## 2019-02-09 DIAGNOSIS — L408 Other psoriasis: Secondary | ICD-10-CM | POA: Diagnosis not present

## 2019-02-12 DIAGNOSIS — L409 Psoriasis, unspecified: Secondary | ICD-10-CM | POA: Diagnosis not present

## 2019-03-09 DIAGNOSIS — H5213 Myopia, bilateral: Secondary | ICD-10-CM | POA: Diagnosis not present

## 2019-03-25 DIAGNOSIS — L408 Other psoriasis: Secondary | ICD-10-CM | POA: Diagnosis not present

## 2019-06-07 ENCOUNTER — Other Ambulatory Visit: Payer: Self-pay

## 2019-06-07 ENCOUNTER — Encounter: Payer: Self-pay | Admitting: Osteopathic Medicine

## 2019-06-07 ENCOUNTER — Ambulatory Visit (INDEPENDENT_AMBULATORY_CARE_PROVIDER_SITE_OTHER): Payer: BC Managed Care – PPO | Admitting: Osteopathic Medicine

## 2019-06-07 VITALS — BP 101/69 | HR 72 | Temp 97.8°F | Ht 64.0 in | Wt 129.1 lb

## 2019-06-07 DIAGNOSIS — F419 Anxiety disorder, unspecified: Secondary | ICD-10-CM | POA: Diagnosis not present

## 2019-06-07 DIAGNOSIS — F329 Major depressive disorder, single episode, unspecified: Secondary | ICD-10-CM

## 2019-06-07 MED ORDER — BUSPIRONE HCL 5 MG PO TABS: 5.0000 mg | ORAL_TABLET | Freq: Two times a day (BID) | ORAL | Status: DC

## 2019-06-07 MED ORDER — SERTRALINE HCL 100 MG PO TABS: 150.0000 mg | ORAL_TABLET | Freq: Every day | ORAL | Status: DC

## 2019-06-07 NOTE — Patient Instructions (Addendum)
Plan:  Zoloft / sertraline:  1.5 tablets daily for next 4-6 weeks (150 mg total daily)  BuSpar / buspirone: Try 5 mg twice daily for a few days Can increase to 5 mg three times per day for a few days Then can try 10 mg twice daily if desired   I can get you set up with a counselor/therapist through our office.  I have placed a referral  Let me know if refills are needed!

## 2019-06-07 NOTE — Progress Notes (Signed)
Brittany Mcknight is a 34 y.o. female who presents to  [place] at [facility]  today, 06/07/19, seeking care for the following:  The encounter diagnosis was Anxiety and depression.     ASSESSMENT & PLAN with other pertinent history/findings:  1. Anxiety and depression  Patient reports increasing difficulty with anxiety/irritability, feeling depressed.  Increased stress with 3 kids, middle child is being particularly difficult, she is hoping this will improve once she is able to go back to kindergarten in school.  Increased her is also an issue since not able to exercise as much, given setting of Covid pandemic.  Has been relatively well controlled on Zoloft 100 mg plus buspirone 5 mg sparing use as needed.  She would like to talk about increasing medications.  See instructions below, - to titrate up on Zoloft and BuSpar.   Patient Instructions  Plan:  Zoloft / sertraline:  1.5 tablets daily for next 4-6 weeks (150 mg total daily)  BuSpar / buspirone: Try 5 mg twice daily for a few days Can increase to 5 mg three times per day for a few days Then can try 10 mg twice daily if desired   I can get you set up with a counselor/therapist through our office.  I have placed a referral  Let me know if refills are needed!           Orders Placed This Encounter  Procedures  . Ambulatory referral to Psychology    Meds ordered this encounter  Medications  . sertraline (ZOLOFT) 100 MG tablet    Sig: Take 1.5 tablets (150 mg total) by mouth daily.  . busPIRone (BUSPAR) 5 MG tablet    Sig: Take 1-2 tablets (5-10 mg total) by mouth 2 (two) times daily.       Follow-up instructions: Return in about 4 weeks (around 07/05/2019) for RECHECK [place] (VIRTUAL VISIT) .       BP 101/69 (BP Location: Right Arm, Patient Position: Sitting, Cuff Size: Normal)   Pulse 72   Temp 97.8 F (36.6 C) (Oral)   Ht  5\' 4"  (1.626 m)   Wt 129 lb 1.3 oz (58.6 kg)   LMP 05/27/2019   BMI 22.16 kg/m   Current Meds  Medication Sig  . busPIRone (BUSPAR) 5 MG tablet Take 1-2 tablets (5-10 mg total) by mouth 2 (two) times daily.  Marland Kitchen NORA-BE 0.35 MG tablet Take 1 tablet (0.35 mg total) by mouth daily.  . sertraline (ZOLOFT) 100 MG tablet Take 1.5 tablets (150 mg total) by mouth daily.  . [DISCONTINUED] busPIRone (BUSPAR) 5 MG tablet Take by mouth.  . [DISCONTINUED] sertraline (ZOLOFT) 100 MG tablet Take 100 mg by mouth daily.    No results found for this or any previous visit (from the past 72 hour(s)).  No results found.  Depression screen PHQ 2/9 06/07/2019  Decreased Interest 2  Down, Depressed, Hopeless 2  PHQ - 2 Score 4  Altered sleeping 0  Tired, decreased energy 0  Change in appetite 0  Feeling bad or failure about yourself  1  Trouble concentrating 0  Moving slowly or fidgety/restless 0  Suicidal thoughts 0  PHQ-9 Score 5  Difficult doing work/chores Somewhat difficult    GAD 7 : Generalized Anxiety Score 06/07/2019  Nervous, Anxious, on Edge 2  Control/stop worrying 2  Worry too much - different things 2  Trouble relaxing 1  Restless 0  Easily annoyed  or irritable 2  Afraid - awful might happen 1  Total GAD 7 Score 10  Anxiety Difficulty Somewhat difficult      All questions at time of visit were answered - patient instructed to contact office with any additional concerns or updates.  ER/RTC precautions were reviewed with the patient.  Please note: voice recognition software was used to produce this document, and typos may escape review. Please contact Dr. Sheppard Coil for any needed clarifications.   Total encounter time: 30 minutes.

## 2019-06-30 ENCOUNTER — Ambulatory Visit (INDEPENDENT_AMBULATORY_CARE_PROVIDER_SITE_OTHER): Payer: BC Managed Care – PPO | Admitting: Psychology

## 2019-06-30 DIAGNOSIS — F441 Dissociative fugue: Secondary | ICD-10-CM | POA: Diagnosis not present

## 2019-06-30 DIAGNOSIS — F411 Generalized anxiety disorder: Secondary | ICD-10-CM

## 2019-07-06 ENCOUNTER — Encounter: Payer: Self-pay | Admitting: Osteopathic Medicine

## 2019-07-06 ENCOUNTER — Other Ambulatory Visit: Payer: Self-pay

## 2019-07-06 ENCOUNTER — Ambulatory Visit (INDEPENDENT_AMBULATORY_CARE_PROVIDER_SITE_OTHER): Payer: BC Managed Care – PPO | Admitting: Osteopathic Medicine

## 2019-07-06 VITALS — BP 100/67 | HR 73 | Temp 97.8°F | Wt 128.1 lb

## 2019-07-06 DIAGNOSIS — F329 Major depressive disorder, single episode, unspecified: Secondary | ICD-10-CM | POA: Diagnosis not present

## 2019-07-06 DIAGNOSIS — F32A Depression, unspecified: Secondary | ICD-10-CM | POA: Insufficient documentation

## 2019-07-06 DIAGNOSIS — F419 Anxiety disorder, unspecified: Secondary | ICD-10-CM

## 2019-07-06 NOTE — Progress Notes (Signed)
Brittany Mcknight is a 34 y.o. female who presents to  [place] at [facility]  today, 07/06/19, seeking care for the following: Marland Kitchen Mental health      ASSESSMENT & PLAN with other pertinent history/findings:  The encounter diagnosis was Anxiety and depression.   Last visit a month ago 06/07/19: Had been on Zoloft 100 mg daily + BuSpar 5 mg prn, --> Zoloft 150 mg daily + BuSpar 5 mg tid to 10 mg bid + referral to behavioral health for counseling.   See below PHQ/GAD.   Today 07/06/19: feeling a bit better, has counseling in place appt for next week would like to stay on current meds         Follow-up instructions: Return in about 6 weeks (around 08/17/2019) for RECHECK [place] / SEE [place] .                                         BP 100/67 (BP Location: Left Arm, Patient Position: Sitting, Cuff Size: Normal)   Pulse 73   Temp 97.8 F (36.6 C) (Oral)   Wt 128 lb 1.9 oz (58.1 kg)   BMI 21.99 kg/m   Current Meds  Medication Sig  . Clobetasol Propionate 0.05 % lotion Apply topically as needed.  Marland Kitchen HUMIRA 40 MG/0.4ML PSKT Inject 80 mg as directed.  Marland Kitchen NORA-BE 0.35 MG tablet Take 1 tablet (0.35 mg total) by mouth daily.  . sertraline (ZOLOFT) 100 MG tablet Take 1.5 tablets (150 mg total) by mouth daily.    No results found for this or any previous visit (from the past 72 hour(s)).  No results found.  Depression screen [facility] 2/9 07/06/2019 06/07/2019  Decreased Interest 2 2  Down, Depressed, Hopeless 1 2  PHQ - 2 Score 3 4  Altered sleeping 1 0  Tired, decreased energy 1 0  Change in appetite 0 0  Feeling bad or failure about yourself  0 1  Trouble concentrating 1 0  Moving slowly or fidgety/restless 0 0  Suicidal thoughts 0 0  PHQ-9 Score 6 5  Difficult doing work/chores Not difficult at all Somewhat difficult    GAD 7 : Generalized Anxiety Score  07/06/2019 06/07/2019  Nervous, Anxious, on Edge 2 2  Control/stop worrying 1 2  Worry too much - different things 1 2  Trouble relaxing 1 1  Restless 0 0  Easily annoyed or irritable 1 2  Afraid - awful might happen 1 1  Total GAD 7 Score 7 10  Anxiety Difficulty Not difficult at all Somewhat difficult      All questions at time of visit were answered - patient instructed to contact office with any additional concerns or updates.  ER/RTC precautions were reviewed with the patient.  Please note: voice recognition software was used to produce this document, and typos may escape review. Please contact Dr. Sheppard Coil for any needed clarifications.

## 2019-07-09 ENCOUNTER — Encounter: Payer: Self-pay | Admitting: Osteopathic Medicine

## 2019-07-09 MED ORDER — SULFAMETHOXAZOLE-TRIMETHOPRIM 800-160 MG PO TABS: 1.0000 | ORAL_TABLET | Freq: Two times a day (BID) | ORAL | 0 refills | Status: DC

## 2019-07-09 MED ORDER — SERTRALINE HCL 100 MG PO TABS: 150.0000 mg | ORAL_TABLET | Freq: Every day | ORAL | 1 refills | Status: DC

## 2019-07-12 ENCOUNTER — Ambulatory Visit (INDEPENDENT_AMBULATORY_CARE_PROVIDER_SITE_OTHER): Payer: BC Managed Care – PPO | Admitting: Psychology

## 2019-07-12 DIAGNOSIS — F331 Major depressive disorder, recurrent, moderate: Secondary | ICD-10-CM | POA: Diagnosis not present

## 2019-07-12 DIAGNOSIS — F411 Generalized anxiety disorder: Secondary | ICD-10-CM | POA: Diagnosis not present

## 2019-07-26 ENCOUNTER — Ambulatory Visit (INDEPENDENT_AMBULATORY_CARE_PROVIDER_SITE_OTHER): Payer: BC Managed Care – PPO | Admitting: Psychology

## 2019-07-26 DIAGNOSIS — F331 Major depressive disorder, recurrent, moderate: Secondary | ICD-10-CM | POA: Diagnosis not present

## 2019-07-26 DIAGNOSIS — F411 Generalized anxiety disorder: Secondary | ICD-10-CM

## 2019-08-03 ENCOUNTER — Telehealth: Payer: BC Managed Care – PPO | Admitting: Physician Assistant

## 2019-08-03 ENCOUNTER — Other Ambulatory Visit: Payer: Self-pay

## 2019-08-03 ENCOUNTER — Ambulatory Visit (INDEPENDENT_AMBULATORY_CARE_PROVIDER_SITE_OTHER): Payer: BC Managed Care – PPO | Admitting: Osteopathic Medicine

## 2019-08-03 ENCOUNTER — Encounter: Payer: Self-pay | Admitting: Osteopathic Medicine

## 2019-08-03 VITALS — BP 96/62 | HR 70 | Temp 98.0°F | Wt 129.0 lb

## 2019-08-03 DIAGNOSIS — Z0000 Encounter for general adult medical examination without abnormal findings: Secondary | ICD-10-CM

## 2019-08-03 DIAGNOSIS — Z09 Encounter for follow-up examination after completed treatment for conditions other than malignant neoplasm: Secondary | ICD-10-CM

## 2019-08-03 DIAGNOSIS — F419 Anxiety disorder, unspecified: Secondary | ICD-10-CM | POA: Diagnosis not present

## 2019-08-03 DIAGNOSIS — F329 Major depressive disorder, single episode, unspecified: Secondary | ICD-10-CM

## 2019-08-03 DIAGNOSIS — R5382 Chronic fatigue, unspecified: Secondary | ICD-10-CM | POA: Diagnosis not present

## 2019-08-03 NOTE — Progress Notes (Signed)
Virtual Visit via Video (App used: Doximity) Note  I connected with      Brittany Mcknight on 08/03/19 at 2:10 PM  by a telemedicine application and verified that I am speaking with the correct person using two identifiers.  Patient is at home I am in office   I discussed the limitations of evaluation and management by telemedicine and the availability of in person appointments. The patient expressed understanding and agreed to proceed.  History of Present Illness: Brittany Mcknight is a 34 y.o. female who would like to discuss mental health follow-up.   We increased Zoloft and BuSpar. Doing well w/ counseling  Over past few mos feeling significant fatigue  Daytime somnolence, taking naps  Some onset insomnia        Depression screen [facility] 2/9 08/03/2019 07/06/2019 06/07/2019  Decreased Interest 1 2 2   Down, Depressed, Hopeless 0 1 2  PHQ - 2 Score 1 3 4   Altered sleeping 2 1 0  Tired, decreased energy 1 1 0  Change in appetite 0 0 0  Feeling bad or failure about yourself  1 0 1  Trouble concentrating 1 1 0  Moving slowly or fidgety/restless 0 0 0  Suicidal thoughts 0 0 0  PHQ-9 Score 6 6 5   Difficult doing work/chores Somewhat difficult Not difficult at all Somewhat difficult   GAD 7 : Generalized Anxiety Score 08/03/2019 07/06/2019 06/07/2019  Nervous, Anxious, on Edge 1 2 2   Control/stop worrying 1 1 2   Worry too much - different things 1 1 2   Trouble relaxing 1 1 1   Restless 0 0 0  Easily annoyed or irritable 0 1 2  Afraid - awful might happen 0 1 1  Total GAD 7 Score 4 7 10   Anxiety Difficulty Somewhat difficult Not difficult at all Somewhat difficult       Observations/Objective: BP 96/62   Pulse 70   Temp 98 F (36.7 C) (Oral)   Wt 129 lb (58.5 kg)   BMI 22.14 kg/m  BP Readings from Last 3 Encounters:  08/03/19 96/62  07/06/19 100/67  06/07/19 101/69   Exam: Normal Speech.  NAD  Lab and Radiology Results No results found for this or any previous  visit (from the past 72 hour(s)). No results found.       Assessment and Plan: 34 y.o. female with The primary encounter diagnosis was Anxiety and depression. Diagnoses of Chronic fatigue and Annual physical exam were also pertinent to this visit.  See pt instructions Labs +/- Rx change Consider sleep study - OSA?  PDMP not reviewed this encounter. Orders Placed This Encounter  Procedures  . CBC  . COMPLETE METABOLIC PANEL WITH GFR  . TSH  . Lipid panel   No orders of the defined types were placed in this encounter.  Patient Instructions  Will get labs! If anything abnormal, will follow up accordingly.  If labs all normal - will first try cutting back on BuSpar If that doesn't help - may consider stopping the Zoloft and switching to Lexapro   Labs at your convenience, first floor of [place] building near the elevator    Instructions sent via [facility]. If MyChart not available, pt was given option for info via personal e-mail w/ no guarantee of protected health info over unsecured e-mail communication, and MyChart sign-up instructions were sent to patient.   Follow Up Instructions: Return for RECHECK [facility] PENDING LAB RESULTS / IF WORSE OR CHANGE.  I discussed the assessment and treatment plan with the patient. The patient was provided an opportunity to ask questions and all were answered. The patient agreed with the plan and demonstrated an understanding of the instructions.   The patient was advised to call back or seek an in-person evaluation if any new concerns, if symptoms worsen or if the condition fails to improve as anticipated.  20 minutes of non-face-to-face time was provided during this encounter.      . . . . . . . . . . . . . Marland Kitchen                   Historical information moved to improve visibility of documentation.  Past Medical History:  Diagnosis Date  . Anxiety   . Depression   . Psoriasis   .  Thyroid disease 2016   Past Surgical History:  Procedure Laterality Date  . broken bone repair     Social History   Tobacco Use  . Smoking status: Never Smoker  . Smokeless tobacco: Never Used  . Tobacco comment: Patient quit 2005  Substance Use Topics  . Alcohol use: No    Alcohol/week: 0.0 standard drinks   family history includes Cancer in her mother; Depression in her father; Diabetes in her maternal grandfather and paternal grandfather; Stroke in her maternal grandfather.  Medications: Current Outpatient Medications  Medication Sig Dispense Refill  . Clobetasol Propionate 0.05 % lotion Apply topically as needed.    Marland Kitchen HUMIRA 40 MG/0.4ML PSKT Inject 80 mg as directed.    Marland Kitchen NORA-BE 0.35 MG tablet Take 1 tablet (0.35 mg total) by mouth daily. 3 Package 4  . sertraline (ZOLOFT) 100 MG tablet Take 1.5 tablets (150 mg total) by mouth daily. 135 tablet 1  . sulfamethoxazole-trimethoprim (BACTRIM DS) 800-160 MG tablet Take 1 tablet by mouth 2 (two) times daily. 6 tablet 0  . busPIRone (BUSPAR) 5 MG tablet Take 1-2 tablets (5-10 mg total) by mouth 2 (two) times daily. (Patient not taking: Reported on 08/03/2019)     No current facility-administered medications for this visit.   Allergies  Allergen Reactions  . Sumatriptan Other (See Comments)    Visual changes and severe headache

## 2019-08-03 NOTE — Patient Instructions (Signed)
Will get labs! If anything abnormal, will follow up accordingly.  If labs all normal - will first try cutting back on BuSpar If that doesn't help - may consider stopping the Zoloft and switching to Lexapro   Labs at your convenience, first floor of [place] building near the elevator

## 2019-08-03 NOTE — Progress Notes (Signed)
If you would like to have a follow up appointment with your provider regarding your anxiety and depression you will need to call their office to schedule an appointment. This service is not typically utilized for follow up appointments and is separate from your primary office.   Please let me know if you have any questions.   --------------------------------------------------------------------------------------  Please contact your primary care physician practice to be seen. Many offices offer virtual options to be seen via video if you are not comfortable going in person to a medical facility at this time.  If you do not have a PCP, [facility] offers a free physician referral service available at [phone]. Our trained staff has the experience, knowledge and resources to put you in touch with a physician who is right for you.   You also have the option of a video visit through [email or web address]  If you are having a true medical emergency please call 911.  NOTE: If you entered your credit card information for this eVisit, you will not be charged. You may see a "hold" on your card for the $35 but that hold will drop off and you will not have a charge processed.  Your e-visit answers were reviewed by a board certified advanced clinical practitioner to complete your personal care plan.  Thank you for using e-Visits.  Greater than 5 minutes, yet less than 10 minutes of time have been spend researching, coordinating, and implementing care for this patient today.

## 2019-08-10 ENCOUNTER — Ambulatory Visit: Payer: BC Managed Care – PPO | Admitting: Psychology

## 2019-08-27 DIAGNOSIS — Z0000 Encounter for general adult medical examination without abnormal findings: Secondary | ICD-10-CM | POA: Diagnosis not present

## 2019-08-27 DIAGNOSIS — R5382 Chronic fatigue, unspecified: Secondary | ICD-10-CM | POA: Diagnosis not present

## 2019-08-27 DIAGNOSIS — Z23 Encounter for immunization: Secondary | ICD-10-CM | POA: Diagnosis not present

## 2019-08-27 DIAGNOSIS — F419 Anxiety disorder, unspecified: Secondary | ICD-10-CM | POA: Diagnosis not present

## 2019-08-27 DIAGNOSIS — F329 Major depressive disorder, single episode, unspecified: Secondary | ICD-10-CM | POA: Diagnosis not present

## 2019-08-28 LAB — CBC
HCT: 40.2 % (ref 35.0–45.0)
Hemoglobin: 13.4 g/dL (ref 11.7–15.5)
MCH: 29.7 pg (ref 27.0–33.0)
MCHC: 33.3 g/dL (ref 32.0–36.0)
MCV: 89.1 fL (ref 80.0–100.0)
MPV: 9.3 fL (ref 7.5–12.5)
Platelets: 273 10*3/uL (ref 140–400)
RBC: 4.51 10*6/uL (ref 3.80–5.10)
RDW: 11.7 % (ref 11.0–15.0)
WBC: 7.3 10*3/uL (ref 3.8–10.8)

## 2019-08-28 LAB — COMPLETE METABOLIC PANEL WITH GFR
AG Ratio: 2.2 (calc) (ref 1.0–2.5)
ALT: 9 U/L (ref 6–29)
AST: 12 U/L (ref 10–30)
Albumin: 3.5 g/dL — ABNORMAL LOW (ref 3.6–5.1)
Alkaline phosphatase (APISO): 39 U/L (ref 31–125)
BUN: 12 mg/dL (ref 7–25)
CO2: 27 mmol/L (ref 20–32)
Calcium: 8.5 mg/dL — ABNORMAL LOW (ref 8.6–10.2)
Chloride: 107 mmol/L (ref 98–110)
Creat: 0.73 mg/dL (ref 0.50–1.10)
GFR, Est African American: 126 mL/min/{1.73_m2} (ref >=60)
GFR, Est Non African American: 109 mL/min/{1.73_m2} (ref >=60)
Globulin: 1.6 g/dL (calc) — ABNORMAL LOW (ref 1.9–3.7)
Glucose, Bld: 79 mg/dL (ref 65–99)
Potassium: 4.1 mmol/L (ref 3.5–5.3)
Sodium: 140 mmol/L (ref 135–146)
Total Bilirubin: 0.4 mg/dL (ref 0.2–1.2)
Total Protein: 5.1 g/dL — ABNORMAL LOW (ref 6.1–8.1)

## 2019-08-28 LAB — LIPID PANEL
Cholesterol: 158 mg/dL (ref <200)
HDL: 44 mg/dL — ABNORMAL LOW (ref >=50)
LDL Cholesterol (Calc): 100 mg/dL (calc) — ABNORMAL HIGH
Non-HDL Cholesterol (Calc): 114 mg/dL (calc) (ref <130)
Total CHOL/HDL Ratio: 3.6 (calc) (ref <5.0)
Triglycerides: 49 mg/dL (ref <150)

## 2019-08-28 LAB — TSH: TSH: 0.9 mIU/L

## 2019-08-30 ENCOUNTER — Encounter: Payer: Self-pay | Admitting: Osteopathic Medicine

## 2019-09-07 ENCOUNTER — Ambulatory Visit (INDEPENDENT_AMBULATORY_CARE_PROVIDER_SITE_OTHER): Payer: BC Managed Care – PPO | Admitting: Psychology

## 2019-09-07 DIAGNOSIS — F331 Major depressive disorder, recurrent, moderate: Secondary | ICD-10-CM

## 2019-09-17 DIAGNOSIS — Z23 Encounter for immunization: Secondary | ICD-10-CM | POA: Diagnosis not present

## 2019-09-23 ENCOUNTER — Ambulatory Visit: Payer: BC Managed Care – PPO | Admitting: Psychology

## 2019-09-27 ENCOUNTER — Ambulatory Visit (INDEPENDENT_AMBULATORY_CARE_PROVIDER_SITE_OTHER): Payer: BC Managed Care – PPO | Admitting: Psychology

## 2019-09-27 DIAGNOSIS — F411 Generalized anxiety disorder: Secondary | ICD-10-CM

## 2019-09-27 DIAGNOSIS — F331 Major depressive disorder, recurrent, moderate: Secondary | ICD-10-CM

## 2019-10-20 ENCOUNTER — Ambulatory Visit: Payer: BC Managed Care – PPO | Admitting: Psychology

## 2019-10-20 ENCOUNTER — Telehealth: Payer: Self-pay

## 2019-10-26 NOTE — Telephone Encounter (Signed)
Error

## 2019-11-09 ENCOUNTER — Ambulatory Visit (INDEPENDENT_AMBULATORY_CARE_PROVIDER_SITE_OTHER): Payer: BC Managed Care – PPO | Admitting: Osteopathic Medicine

## 2019-11-09 ENCOUNTER — Encounter: Payer: Self-pay | Admitting: Osteopathic Medicine

## 2019-11-09 VITALS — BP 121/76 | HR 89 | Temp 98.0°F | Wt 132.0 lb

## 2019-11-09 DIAGNOSIS — B029 Zoster without complications: Secondary | ICD-10-CM

## 2019-11-09 MED ORDER — VALACYCLOVIR HCL 1 G PO TABS: 1000.0000 mg | ORAL_TABLET | Freq: Three times a day (TID) | ORAL | 0 refills | Status: AC

## 2019-11-09 MED ORDER — LIDOCAINE 5 % EX OINT
1.0000 | TOPICAL_OINTMENT | Freq: Four times a day (QID) | CUTANEOUS | 1 refills | Status: DC | PRN
Start: 2019-11-09 — End: 2020-06-07

## 2019-11-09 NOTE — Patient Instructions (Signed)
Shingles  Shingles, which is also known as herpes zoster, is an infection that causes a painful skin rash and fluid-filled blisters. It is caused by a virus. Shingles only develops in people who:  Have had chickenpox.  Have been given a medicine to protect against chickenpox (have been vaccinated). Shingles is rare in this group. What are the causes? Shingles is caused by varicella-zoster virus (VZV). This is the same virus that causes chickenpox. After a person is exposed to VZV, the virus stays in the body in an inactive (dormant) state. Shingles develops if the virus is reactivated. This can happen many years after the first (initial) exposure to VZV. It is not known what causes this virus to be reactivated. What increases the risk? People who have had chickenpox or received the chickenpox vaccine are at risk for shingles. Shingles infection is more common in people who:  Are older than age 60.  Have a weakened disease-fighting system (immune system), such as people with: ? HIV. ? AIDS. ? Cancer.  Are taking medicines that weaken the immune system, such as transplant medicines.  Are experiencing a lot of stress. What are the signs or symptoms? Early symptoms of this condition include itching, tingling, and pain in an area on your skin. Pain may be described as burning, stabbing, or throbbing. A few days or weeks after early symptoms start, a painful red rash appears. The rash is usually on one side of the body and has a band-like or belt-like pattern. The rash eventually turns into fluid-filled blisters that break open, change into scabs, and dry up in about 2-3 weeks. At any time during the infection, you may also develop:  A fever.  Chills.  A headache.  An upset stomach. How is this diagnosed? This condition is diagnosed with a skin exam. Skin or fluid samples may be taken from the blisters before a diagnosis is made. These samples are examined under a microscope or sent to  a lab for testing. How is this treated? The rash may last for several weeks. There is not a specific cure for this condition. Your health care provider will probably prescribe medicines to help you manage pain, recover more quickly, and avoid long-term problems. Medicines may include:  Antiviral drugs.  Anti-inflammatory drugs.  Pain medicines.  Anti-itching medicines (antihistamines). If the area involved is on your face, you may be referred to a specialist, such as an eye doctor (ophthalmologist) or an ear, nose, and throat (ENT) doctor (otolaryngologist) to help you avoid eye problems, chronic pain, or disability. Follow these instructions at home: Medicines  Take over-the-counter and prescription medicines only as told by your health care provider.  Apply an anti-itch cream or numbing cream to the affected area as told by your health care provider. Relieving itching and discomfort   Apply cold, wet cloths (cold compresses) to the area of the rash or blisters as told by your health care provider.  Cool baths can be soothing. Try adding baking soda or dry oatmeal to the water to reduce itching. Do not bathe in hot water. Blister and rash care  Keep your rash covered with a loose bandage (dressing). Wear loose-fitting clothing to help ease the pain of material rubbing against the rash.  Keep your rash and blisters clean by washing the area with mild soap and cool water as told by your health care provider.  Check your rash every day for signs of infection. Check for: ? More redness, swelling, or pain. ? Fluid   or blood. ? Warmth. ? Pus or a bad smell.  Do not scratch your rash or pick at your blisters. To help avoid scratching: ? Keep your fingernails clean and cut short. ? Wear gloves or mittens while you sleep, if scratching is a problem. General instructions  Rest as told by your health care provider.  Keep all follow-up visits as told by your health care provider. This  is important.  Wash your hands often with soap and water. If soap and water are not available, use hand sanitizer. Doing this lowers your chance of getting a bacterial skin infection.  Before your blisters change into scabs, your shingles infection can cause chickenpox in people who have never had it or have never been vaccinated against it. To prevent this from happening, avoid contact with other people, especially: ? Babies. ? Pregnant women. ? Children who have eczema. ? Elderly people who have transplants. ? People who have chronic illnesses, such as cancer or AIDS. Contact a health care provider if:  Your pain is not relieved with prescribed medicines.  Your pain does not get better after the rash heals.  You have signs of infection in the rash area, such as: ? More redness, swelling, or pain around the rash. ? Fluid or blood coming from the rash. ? The rash area feeling warm to the touch. ? Pus or a bad smell coming from the rash. Get help right away if:  The rash is on your face or nose.  You have facial pain, pain around your eye area, or loss of feeling on one side of your face.  You have difficulty seeing.  You have ear pain or have ringing in your ear.  You have a loss of taste.  Your condition gets worse. Summary  Shingles, which is also known as herpes zoster, is an infection that causes a painful skin rash and fluid-filled blisters.  This condition is diagnosed with a skin exam. Skin or fluid samples may be taken from the blisters and examined before the diagnosis is made.  Keep your rash covered with a loose bandage (dressing). Wear loose-fitting clothing to help ease the pain of material rubbing against the rash.  Before your blisters change into scabs, your shingles infection can cause chickenpox in people who have never had it or have never been vaccinated against it. This information is not intended to replace advice given to you by your health care  provider. Make sure you discuss any questions you have with your health care provider. Document Revised: 08/14/2018 Document Reviewed: 12/25/2016 Elsevier Patient Education  2020 [employer]  

## 2019-11-09 NOTE — Progress Notes (Signed)
HPI: Brittany Mcknight is a 34 y.o. female who  has a past medical history of Anxiety, Depression, Psoriasis, and Thyroid disease (2016).  she presents to [facility] today, 11/09/19,  for chief complaint of: Rash to left buttock and vulva  Patient reports a red bumpy rash to her left lower buttock and left vulva that has been present for about 2 weeks. She states it has spread over the last couple days. She reports a burning sensation in the area prior to the rash. She also endorses burning pain in the area, which is worse with friction from certain fabrics or walking. She denies vaginal discharge, urinary changes, fever, recent illness, recent sick contacts. She states she has been monogamous with her husband for the last 56 years and denies new sexual partners. She believes she had chicken pox as a child, but is unsure. She denies any known illness in her children or their friends recently.   Past medical, surgical, social and family history reviewed:  Patient Active Problem List   Diagnosis Date Noted   Anxiety and depression 07/06/2019   Patellofemoral pain syndrome of both knees 02/08/2016   Psoriasis 05/26/2015    Past Surgical History:  Procedure Laterality Date   broken bone repair      Social History   Tobacco Use   Smoking status: Never Smoker   Smokeless tobacco: Never Used   Tobacco comment: Patient quit 2005  Substance Use Topics   Alcohol use: No    Alcohol/week: 0.0 standard drinks    Family History  Problem Relation Age of Onset   Depression Father    Cancer Mother        PANCREATIC   Diabetes Maternal Grandfather    Stroke Maternal Grandfather    Diabetes Paternal Grandfather      Current medication list and allergy/intolerance information reviewed:    Current Outpatient Medications  Medication Sig Dispense Refill   Clobetasol Propionate 0.05 % lotion Apply topically as needed.     HUMIRA 40 MG/0.4ML  PSKT Inject 80 mg as directed.     NORA-BE 0.35 MG tablet Take 1 tablet (0.35 mg total) by mouth daily. 3 Package 4   sulfamethoxazole-trimethoprim (BACTRIM DS) 800-160 MG tablet Take 1 tablet by mouth 2 (two) times daily. 6 tablet 0   busPIRone (BUSPAR) 5 MG tablet Take 1-2 tablets (5-10 mg total) by mouth 2 (two) times daily. (Patient not taking: Reported on 08/03/2019)     lidocaine (XYLOCAINE) 5 % ointment Apply 1 application topically 4 (four) times daily as needed. To numb skin 30 g 1   sertraline (ZOLOFT) 100 MG tablet Take 1.5 tablets (150 mg total) by mouth daily. 135 tablet 1   valACYclovir (VALTREX) 1000 MG tablet Take 1 tablet (1,000 mg total) by mouth 3 (three) times daily for 7 days. 21 tablet 0   No current facility-administered medications for this visit.    Allergies  Allergen Reactions   Sumatriptan Other (See Comments)    Visual changes and severe headache       Review of Systems:  Constitutional:  No  fever, no chills, No recent illness.  Respiratory:  No  shortness of breath.   Gastrointestinal: No  abdominal pain  Musculoskeletal: No new myalgia/arthralgia  Skin: Rash to left vulva and left buttock  Genitourinary: No  incontinence, No  abnormal genital bleeding, No abnormal genital discharge  Neurologic: No  weakness, No  dizziness   Exam:  BP 121/76 (BP  Location: Right Arm, Patient Position: Sitting)    Pulse 89    Temp 98 F (36.7 C)    Wt 132 lb (59.9 kg)    SpO2 98%    BMI 22.66 kg/m   Constitutional: VS see above. General Appearance: alert, well-developed, well-nourished, NAD  Neck: No masses, trachea midline. No thyroid enlargement. No tenderness/mass appreciated. No lymphadenopathy  Respiratory: Normal respiratory effort. no wheeze, no rhonchi, no rales  Cardiovascular: S1/S2 normal, no murmur, no rub/gallop auscultated. RRR. No lower extremity edema.  Gastrointestinal: Nontender, no masses. No hepatomegaly, no splenomegaly. Rectal  exam deferred.  Musculoskeletal: Gait normal. No clubbing/cyanosis of digits.   Neurological: Normal balance/coordination. No tremor. No cranial nerve deficit on limited exam.   Skin: Scattered subcentimeter erythematous papules to left labia majora. Clusters of vescicles over lower left buttock with white skin. warm, dry, intact. No concerning nevi or subq nodules on limited exam.    Psychiatric: Normal judgment/insight. Normal mood and affect. Oriented x3.    No results found for this or any previous visit (from the past 72 hour(s)).  No results found.   ASSESSMENT/PLAN: The encounter diagnosis was Herpes zoster without complication.   Low risk for HSV but possible. History and exam concerning for shingles. Prescribed 7 day course of valacyclovir for shingles.  Also prescribed lidocaine gel for pain.  Follow up if rash worsens or does not improve.  No orders of the defined types were placed in this encounter.   Meds ordered this encounter  Medications   valACYclovir (VALTREX) 1000 MG tablet    Sig: Take 1 tablet (1,000 mg total) by mouth 3 (three) times daily for 7 days.    Dispense:  21 tablet    Refill:  0   lidocaine (XYLOCAINE) 5 % ointment    Sig: Apply 1 application topically 4 (four) times daily as needed. To numb skin    Dispense:  30 g    Refill:  1    Patient Instructions  Shingles  Shingles, which is also known as herpes zoster, is an infection that causes a painful skin rash and fluid-filled blisters. It is caused by a virus. Shingles only develops in people who:  Have had chickenpox.  Have been given a medicine to protect against chickenpox (have been vaccinated). Shingles is rare in this group. What are the causes? Shingles is caused by varicella-zoster virus (VZV). This is the same virus that causes chickenpox. After a person is exposed to VZV, the virus stays in the body in an inactive (dormant) state. Shingles develops if the virus is  reactivated. This can happen many years after the first (initial) exposure to VZV. It is not known what causes this virus to be reactivated. What increases the risk? People who have had chickenpox or received the chickenpox vaccine are at risk for shingles. Shingles infection is more common in people who:  Are older than age 50.  Have a weakened disease-fighting system (immune system), such as people with: ? HIV. ? AIDS. ? Cancer.  Are taking medicines that weaken the immune system, such as transplant medicines.  Are experiencing a lot of stress. What are the signs or symptoms? Early symptoms of this condition include itching, tingling, and pain in an area on your skin. Pain may be described as burning, stabbing, or throbbing. A few days or weeks after early symptoms start, a painful red rash appears. The rash is usually on one side of the body and has a band-like or belt-like pattern.  The rash eventually turns into fluid-filled blisters that break open, change into scabs, and dry up in about 2-3 weeks. At any time during the infection, you may also develop:  A fever.  Chills.  A headache.  An upset stomach. How is this diagnosed? This condition is diagnosed with a skin exam. Skin or fluid samples may be taken from the blisters before a diagnosis is made. These samples are examined under a microscope or sent to a lab for testing. How is this treated? The rash may last for several weeks. There is not a specific cure for this condition. Your health care provider will probably prescribe medicines to help you manage pain, recover more quickly, and avoid long-term problems. Medicines may include:  Antiviral drugs.  Anti-inflammatory drugs.  Pain medicines.  Anti-itching medicines (antihistamines). If the area involved is on your face, you may be referred to a specialist, such as an eye doctor (ophthalmologist) or an ear, nose, and throat (ENT) doctor (otolaryngologist) to help you  avoid eye problems, chronic pain, or disability. Follow these instructions at home: Medicines  Take over-the-counter and prescription medicines only as told by your health care provider.  Apply an anti-itch cream or numbing cream to the affected area as told by your health care provider. Relieving itching and discomfort   Apply cold, wet cloths (cold compresses) to the area of the rash or blisters as told by your health care provider.  Cool baths can be soothing. Try adding baking soda or dry oatmeal to the water to reduce itching. Do not bathe in hot water. Blister and rash care  Keep your rash covered with a loose bandage (dressing). Wear loose-fitting clothing to help ease the pain of material rubbing against the rash.  Keep your rash and blisters clean by washing the area with mild soap and cool water as told by your health care provider.  Check your rash every day for signs of infection. Check for: ? More redness, swelling, or pain. ? Fluid or blood. ? Warmth. ? Pus or a bad smell.  Do not scratch your rash or pick at your blisters. To help avoid scratching: ? Keep your fingernails clean and cut short. ? Wear gloves or mittens while you sleep, if scratching is a problem. General instructions  Rest as told by your health care provider.  Keep all follow-up visits as told by your health care provider. This is important.  Wash your hands often with soap and water. If soap and water are not available, use hand sanitizer. Doing this lowers your chance of getting a bacterial skin infection.  Before your blisters change into scabs, your shingles infection can cause chickenpox in people who have never had it or have never been vaccinated against it. To prevent this from happening, avoid contact with other people, especially: ? Babies. ? Pregnant women. ? Children who have eczema. ? Elderly people who have transplants. ? People who have chronic illnesses, such as cancer or  AIDS. Contact a health care provider if:  Your pain is not relieved with prescribed medicines.  Your pain does not get better after the rash heals.  You have signs of infection in the rash area, such as: ? More redness, swelling, or pain around the rash. ? Fluid or blood coming from the rash. ? The rash area feeling warm to the touch. ? Pus or a bad smell coming from the rash. Get help right away if:  The rash is on your face or nose.  You have facial pain, pain around your eye area, or loss of feeling on one side of your face.  You have difficulty seeing.  You have ear pain or have ringing in your ear.  You have a loss of taste.  Your condition gets worse. Summary  Shingles, which is also known as herpes zoster, is an infection that causes a painful skin rash and fluid-filled blisters.  This condition is diagnosed with a skin exam. Skin or fluid samples may be taken from the blisters and examined before the diagnosis is made.  Keep your rash covered with a loose bandage (dressing). Wear loose-fitting clothing to help ease the pain of material rubbing against the rash.  Before your blisters change into scabs, your shingles infection can cause chickenpox in people who have never had it or have never been vaccinated against it. This information is not intended to replace advice given to you by your health care provider. Make sure you discuss any questions you have with your health care provider. Document Revised: 08/14/2018 Document Reviewed: 12/25/2016 Elsevier Patient Education  2020 [place].   Visit summary with medication list and pertinent instructions was printed for patient to review. All questions at time of visit were answered - patient instructed to contact office with any additional concerns or updates. ER/RTC precautions were reviewed with the patient.   Please note: voice recognition software was used to produce this document, and typos may escape review.  Please contact Dr. Sheppard Coil for any needed clarifications.     Follow-up plan: Return if symptoms worsen or fail to improve.

## 2019-11-11 ENCOUNTER — Telehealth: Payer: Self-pay

## 2019-11-11 MED ORDER — LIDOCAINE-PRILOCAINE 2.5-2.5 % EX CREA
1.0000 | TOPICAL_CREAM | CUTANEOUS | 0 refills | Status: DC | PRN
Start: 2019-11-11 — End: 2020-06-07

## 2019-11-11 NOTE — Telephone Encounter (Signed)
Alternative sent

## 2019-11-11 NOTE — Telephone Encounter (Signed)
Brittany Mcknight wanted to know what she can take for the pain until the PA for the Lidocaine is approved.

## 2019-11-11 NOTE — Telephone Encounter (Signed)
Left a message advising of new prescription.

## 2019-11-11 NOTE — Telephone Encounter (Signed)
Task completed. Left a detailed vm msg for pt regarding alternative pain med. Direct call back info provided.

## 2019-11-12 ENCOUNTER — Other Ambulatory Visit: Payer: Self-pay

## 2019-11-12 DIAGNOSIS — Z3041 Encounter for surveillance of contraceptive pills: Secondary | ICD-10-CM

## 2019-11-12 MED ORDER — NORA-BE 0.35 MG PO TABS: 1.0000 | ORAL_TABLET | Freq: Every day | ORAL | 2 refills | Status: DC

## 2020-01-05 ENCOUNTER — Other Ambulatory Visit: Payer: Self-pay | Admitting: Osteopathic Medicine

## 2020-01-05 DIAGNOSIS — Z3041 Encounter for surveillance of contraceptive pills: Secondary | ICD-10-CM

## 2020-01-07 NOTE — Telephone Encounter (Signed)
This encounter was created in error - please disregard.

## 2020-02-24 ENCOUNTER — Other Ambulatory Visit: Payer: Self-pay | Admitting: Osteopathic Medicine

## 2020-02-24 ENCOUNTER — Encounter: Payer: Self-pay | Admitting: Osteopathic Medicine

## 2020-02-25 MED ORDER — BUSPIRONE HCL 5 MG PO TABS: 5.0000 mg | ORAL_TABLET | Freq: Two times a day (BID) | ORAL | 1 refills | Status: DC

## 2020-03-08 ENCOUNTER — Other Ambulatory Visit: Payer: Self-pay

## 2020-03-08 MED ORDER — SERTRALINE HCL 100 MG PO TABS: 150.0000 mg | ORAL_TABLET | Freq: Every day | ORAL | 1 refills | Status: DC

## 2020-03-23 ENCOUNTER — Other Ambulatory Visit: Payer: Self-pay | Admitting: Osteopathic Medicine

## 2020-04-02 ENCOUNTER — Other Ambulatory Visit: Payer: Self-pay | Admitting: Osteopathic Medicine

## 2020-04-02 DIAGNOSIS — Z3041 Encounter for surveillance of contraceptive pills: Secondary | ICD-10-CM

## 2020-06-07 ENCOUNTER — Ambulatory Visit (INDEPENDENT_AMBULATORY_CARE_PROVIDER_SITE_OTHER): Payer: BC Managed Care – PPO | Admitting: Osteopathic Medicine

## 2020-06-07 ENCOUNTER — Encounter: Payer: Self-pay | Admitting: Osteopathic Medicine

## 2020-06-07 ENCOUNTER — Ambulatory Visit (INDEPENDENT_AMBULATORY_CARE_PROVIDER_SITE_OTHER): Payer: Medicaid Other

## 2020-06-07 ENCOUNTER — Other Ambulatory Visit: Payer: Self-pay

## 2020-06-07 VITALS — BP 106/73 | HR 78 | Temp 97.8°F | Wt 139.0 lb

## 2020-06-07 DIAGNOSIS — G8929 Other chronic pain: Secondary | ICD-10-CM

## 2020-06-07 DIAGNOSIS — Z23 Encounter for immunization: Secondary | ICD-10-CM | POA: Diagnosis not present

## 2020-06-07 DIAGNOSIS — M25561 Pain in right knee: Secondary | ICD-10-CM

## 2020-06-07 DIAGNOSIS — Z3009 Encounter for other general counseling and advice on contraception: Secondary | ICD-10-CM

## 2020-06-07 DIAGNOSIS — R635 Abnormal weight gain: Secondary | ICD-10-CM

## 2020-06-07 DIAGNOSIS — M25461 Effusion, right knee: Secondary | ICD-10-CM

## 2020-06-07 NOTE — Progress Notes (Signed)
Brittany Mcknight is a 35 y.o. female who presents to  [place] [facility]  today, 06/07/20, seeking care for the following:  . Discussed contraception: Patient is not interested in future pregnancy.  She states her husband is reluctant to pursue vasectomy.  She does not want to be on birth control anymore. . Right knee pain, patient states feels tightness/sharp pain for up to a minute or 2 after sitting with knee tightly flexed, crosslegged.  This seems to be the only time it really bothers her.  On exam, stable ligamentous exam, no crepitus or pain with active/passive flexion or extension of knee, negative meniscal signs, no palpable cords or Baker's cyst.     ASSESSMENT & PLAN with other pertinent findings:  The primary encounter diagnosis was Chronic pain of right knee. Diagnoses of Need for influenza vaccination, Encounter for other general counseling or advice on contraception, and Weight gain were also pertinent to this visit.   Referred to OB/GYN to discuss BTL  Not sure what to make of this knee pain, I think consult with physical therapist might be helpful, sounds like she is noticing some tightening of muscles/tendons/ligaments, does not seem to be related to the joint.  Possibly may benefit from compression sleeve, Kine-tape, TENS unit.  Would have low threshold for referral to sports medicine.  X-ray pending, may consider MRI      There are no Patient Instructions on file for this visit.  Orders Placed This Encounter  Procedures  . DG Knee Complete 4 Views Right  . Flu Vaccine QUAD 6+ mos PF IM (Fluarix Quad PF)  . CBC  . COMPLETE METABOLIC PANEL WITH GFR  . Lipid panel  . TSH  . Ambulatory referral to Physical Therapy  . Ambulatory referral to Obstetrics / Gynecology    No orders of the defined types were placed in this encounter.      Follow-up instructions: Return for VISIT WITH SPORTS MEDICINE FOR ORTHOPEDIC  ISSUE IF KNEE PAIN PERSISTS .                                         BP 106/73 (BP Location: Left Arm, Patient Position: Sitting, Cuff Size: Normal)   Pulse 78   Temp 97.8 F (36.6 C) (Oral)   Wt 139 lb (63 kg)   LMP 05/28/2020   BMI 23.86 kg/m   Current Meds  Medication Sig  . busPIRone (BUSPAR) 5 MG tablet TAKE 1-2 TABLETS (5-10 MG TOTAL) BY MOUTH 2 (TWO) TIMES DAILY.  . Clobetasol Propionate 0.05 % lotion Apply topically as needed.  Marland Kitchen HUMIRA 40 MG/0.4ML PSKT Inject 80 mg as directed.  Marland Kitchen NORA-BE 0.35 MG tablet TAKE 1 TABLET BY MOUTH EVERY DAY  . [DISCONTINUED] lidocaine (XYLOCAINE) 5 % ointment Apply 1 application topically 4 (four) times daily as needed. To numb skin  . [DISCONTINUED] lidocaine-prilocaine (EMLA) cream Apply 1 application topically as needed. Please run w/ goodrx card  . [DISCONTINUED] sulfamethoxazole-trimethoprim (BACTRIM DS) 800-160 MG tablet Take 1 tablet by mouth 2 (two) times daily.    Results for orders placed or performed in visit on 06/07/20 (from the past 72 hour(s))  CBC     Status: None   Collection Time: 06/07/20 12:00 AM  Result Value Ref Range   WBC 9.4 3.8 - 10.8 Thousand/uL   RBC 4.77 3.80 - 5.10  Million/uL   Hemoglobin 14.0 11.7 - 15.5 g/dL   HCT 42.0 35.0 - 45.0 %   MCV 88.1 80.0 - 100.0 fL   MCH 29.4 27.0 - 33.0 pg   MCHC 33.3 32.0 - 36.0 g/dL   RDW 12.0 11.0 - 15.0 %   Platelets 321 140 - 400 Thousand/uL   MPV 9.5 7.5 - 12.5 fL    DG Knee Complete 4 Views Right  Result Date: 06/07/2020 CLINICAL DATA:  Right knee pain for 1 1/2 weeks, no injury. EXAM: RIGHT KNEE - COMPLETE 4+ VIEW COMPARISON:  02/08/2016. FINDINGS: Difficult to exclude a small joint effusion. Knee is otherwise unremarkable. IMPRESSION: Difficult to exclude a small joint effusion. Otherwise unremarkable. Electronically Signed   By: Lorin Picket M.D.   On: 06/07/2020 14:46       All questions at time of visit were answered -  patient instructed to contact office with any additional concerns or updates.  ER/RTC precautions were reviewed with the patient as applicable.   Please note: voice recognition software was used to produce this document, and typos may escape review. Please contact Dr. Sheppard Coil for any needed clarifications.

## 2020-06-08 LAB — CBC
HCT: 42 % (ref 35.0–45.0)
Hemoglobin: 14 g/dL (ref 11.7–15.5)
MCH: 29.4 pg (ref 27.0–33.0)
MCHC: 33.3 g/dL (ref 32.0–36.0)
MCV: 88.1 fL (ref 80.0–100.0)
MPV: 9.5 fL (ref 7.5–12.5)
Platelets: 321 10*3/uL (ref 140–400)
RBC: 4.77 10*6/uL (ref 3.80–5.10)
RDW: 12 % (ref 11.0–15.0)
WBC: 9.4 10*3/uL (ref 3.8–10.8)

## 2020-06-08 LAB — COMPLETE METABOLIC PANEL WITH GFR
AG Ratio: 2 (calc) (ref 1.0–2.5)
ALT: 11 U/L (ref 6–29)
AST: 15 U/L (ref 10–30)
Albumin: 4.4 g/dL (ref 3.6–5.1)
Alkaline phosphatase (APISO): 53 U/L (ref 31–125)
BUN: 12 mg/dL (ref 7–25)
CO2: 30 mmol/L (ref 20–32)
Calcium: 9.3 mg/dL (ref 8.6–10.2)
Chloride: 105 mmol/L (ref 98–110)
Creat: 0.74 mg/dL (ref 0.50–1.10)
GFR, Est African American: 123 mL/min/{1.73_m2} (ref >=60)
GFR, Est Non African American: 106 mL/min/{1.73_m2} (ref >=60)
Globulin: 2.2 g/dL (calc) (ref 1.9–3.7)
Glucose, Bld: 68 mg/dL (ref 65–139)
Potassium: 4 mmol/L (ref 3.5–5.3)
Sodium: 141 mmol/L (ref 135–146)
Total Bilirubin: 0.6 mg/dL (ref 0.2–1.2)
Total Protein: 6.6 g/dL (ref 6.1–8.1)

## 2020-06-08 LAB — TSH: TSH: 0.93 mIU/L

## 2020-06-09 ENCOUNTER — Ambulatory Visit (INDEPENDENT_AMBULATORY_CARE_PROVIDER_SITE_OTHER): Payer: BC Managed Care – PPO | Admitting: Sports Medicine

## 2020-06-09 ENCOUNTER — Other Ambulatory Visit: Payer: Self-pay

## 2020-06-09 DIAGNOSIS — M222X1 Patellofemoral disorders, right knee: Secondary | ICD-10-CM

## 2020-06-09 DIAGNOSIS — M222X2 Patellofemoral disorders, left knee: Secondary | ICD-10-CM | POA: Diagnosis not present

## 2020-06-09 MED ORDER — MELOXICAM 15 MG PO TABS: ORAL_TABLET | ORAL | 3 refills | Status: DC

## 2020-06-09 NOTE — Progress Notes (Signed)
    Procedures performed today:    None.  Independent interpretation of notes and tests performed by another provider:   None.  Brief History, Exam, Impression, and Recommendations:    Patellofemoral pain syndrome of both knees This is a very pleasant 35 year old female, I saw her approximately 4 years ago for patellofemoral type pain, we treated her with conservative measures and she did well. Unfortunately she is starting to hurt again, she does work out a lot, runs 2 miles a day, goes to [employer]. She has pain at the posterior/lateral aspect of the knee without major trauma, exam is for the most part benign with the exception of a positive McMurray's sign with reproduction of posterior/lateral pain without an obvious pop, she does endorse some mechanical symptoms, we will treat her conservatively, reaction knee brace, meniscal rehab exercises, meloxicam. Return to see me in 4 weeks, injection/MRI if no better.    ___________________________________________ Gwen Her. Dianah Field, M.D., ABFM., CAQSM. Primary Care and [place] [employer] of [employer] [facility]

## 2020-06-09 NOTE — Assessment & Plan Note (Signed)
This is a very pleasant 35 year old female, I saw her approximately 4 years ago for patellofemoral type pain, we treated her with conservative measures and she did well. Unfortunately she is starting to hurt again, she does work out a lot, runs 2 miles a day, goes to [employer]. She has pain at the posterior/lateral aspect of the knee without major trauma, exam is for the most part benign with the exception of a positive McMurray's sign with reproduction of posterior/lateral pain without an obvious pop, she does endorse some mechanical symptoms, we will treat her conservatively, reaction knee brace, meniscal rehab exercises, meloxicam. Return to see me in 4 weeks, injection/MRI if no better.

## 2020-06-19 ENCOUNTER — Encounter: Payer: Medicaid Other | Admitting: Obstetrics & Gynecology

## 2020-06-19 ENCOUNTER — Encounter: Payer: Self-pay | Admitting: Obstetrics & Gynecology

## 2020-06-19 ENCOUNTER — Ambulatory Visit: Payer: BC Managed Care – PPO | Admitting: Obstetrics & Gynecology

## 2020-06-19 ENCOUNTER — Other Ambulatory Visit: Payer: Self-pay

## 2020-06-19 VITALS — BP 106/59 | HR 58 | Ht 64.0 in | Wt 140.0 lb

## 2020-06-19 DIAGNOSIS — Z30019 Encounter for initial prescription of contraceptives, unspecified: Secondary | ICD-10-CM | POA: Diagnosis not present

## 2020-06-19 NOTE — Patient Instructions (Addendum)
Laparoscopic Tubal Ligation vs Laparoscopic Bilateral Tubal Removal/Salpingectomy  Laparoscopic tubal ligation is a procedure to close the fallopian tubes. This is done to prevent pregnancy. When the fallopian tubes are closed, the eggs that your ovaries release cannot enter the uterus, and sperm cannot reach the released eggs. You should not have this procedure if you want to get pregnant someday or if you are unsure about having more children. Tell a health care provider about:  Any allergies you have.  All medicines you are taking, including vitamins, herbs, eye drops, creams, and over-the-counter medicines.  Any problems you or family members have had with anesthetic medicines.  Any blood disorders you have.  Any surgeries you have had.  Any medical conditions you have.  Whether you are pregnant or may be pregnant.  Any past pregnancies. What are the risks? Generally, this is a safe procedure. However, problems may occur, including:  Infection.  Bleeding.  Injury to other organs in the abdomen.  Side effects from anesthetic medicines.  Failure of the procedure. This procedure can increase your risk of an ectopic pregnancy. This is a pregnancy in which a fertilized egg attaches to the outside of the uterus. What happens before the procedure? Staying hydrated Follow instructions from your health care provider about hydration, which may include:  Up to 2 hours before the procedure - you may continue to drink clear liquids, such as water, clear fruit juice, black coffee, and plain tea. Eating and drinking restrictions Follow instructions from your health care provider about eating and drinking, which may include:  8 hours before the procedure - stop eating heavy meals or foods, such as meat, fried foods, or fatty foods.  6 hours before the procedure - stop eating light meals or foods, such as toast or cereal.  6 hours before the procedure - stop drinking milk or drinks  that contain milk.  2 hours before the procedure - stop drinking clear liquids. Medicines Ask your health care provider about:  Changing or stopping your regular medicines. This is especially important if you are taking diabetes medicines or blood thinners.  Taking medicines such as aspirin and ibuprofen. These medicines can thin your blood. Do not take these medicines unless your health care provider tells you to take them.  Taking over-the-counter medicines, vitamins, herbs, and supplements. Surgery safety Ask your health care provider:  How your surgery site will be marked.  What steps will be taken to help prevent infection. These steps may include: ? Removing hair at the surgery site. ? Washing skin with a germ-killing soap. ? Taking antibiotic medicine. General instructions  Do not use any products that contain nicotine or tobacco for at least 4 weeks before the procedure. These products include cigarettes, chewing tobacco, and vaping devices, such as e-cigarettes. If you need help quitting, ask your health care provider.  Plan to have someone take you home from the hospital.  If you will be going home right after the procedure, plan to have a responsible adult care for you for the time you are told. This is important. What happens during the procedure?  An IV will be inserted into one of your veins.  You will be given one or more of the following: ? A medicine to help you relax (sedative). ? A medicine to numb the area (local anesthetic). ? A medicine to make you fall asleep (general anesthetic). ? A medicine that is injected into an area of your body to numb everything below the injection  site (regional anesthetic).  Your bladder may be emptied with a small tube (catheter).  If you have been given a general anesthetic, a tube will be put down your throat to help you breathe.  Two small incisions will be made in your lower abdomen and near your belly button.  Your  abdomen will be inflated with a gas. This will let the surgeon see better and will give the surgeon room to work.  A lighted tube with camera (laparoscope) will be inserted into your abdomen through one of the incisions. Small instruments will be inserted through the other incision.  The fallopian tubes will be tied off, burned (cauterized), or blocked with a clip, ring, or clamp. A small portion in the center of each fallopian tube may be removed.  The gas will be released from the abdomen.  The incisions will be closed with stitches (sutures).  A bandage (dressing) will be placed over the incisions. The procedure may vary among health care providers and hospitals.      What happens after the procedure?  Your blood pressure, heart rate, breathing rate, and blood oxygen level will be monitored until you leave the hospital.  You will be given medicine to help with pain, nausea, and vomiting as needed.  You may have vaginal discharge after the procedure. You may need to wear a sanitary napkin.  If you were given a sedative during the procedure, it can affect you for several hours. Do not drive or operate machinery until your health care provider says that it is safe. Summary  Laparoscopic tubal ligation is a procedure that is done to prevent pregnancy.  You should not have this procedure if you want to get pregnant someday or if you are unsure about having more children.  The procedure is done using a thin, lighted tube (laparoscope) with a camera attached that will be inserted into your abdomen through an incision.  After the procedure you will be given medicine to help with pain, nausea, and vomiting as needed.  Plan to have someone take you home from the hospital. This information is not intended to replace advice given to you by your health care provider. Make sure you discuss any questions you have with your health care provider. Document Revised: 01/07/2020 Document Reviewed:  01/07/2020 Elsevier Patient Education  2021 [place].    Intrauterine Device Insertion An intrauterine device (IUD) is a medical device that is inserted into the uterus to prevent pregnancy. It is a small, T-shaped device that has one or two nylon strings hanging down from it. The strings hang out of the lower part of the uterus (cervix) to allow for future IUD removal. There are two types of IUDs:  Hormone IUD. This type of IUD is made of plastic and contains the hormone progestin (synthetic progesterone). A hormone IUD may last 3-5 years, depending on which one you have. Synthetic progesterone prevents pregnancy by: ? Thickening cervical mucus to prevent sperm from entering the uterus. ? Thinning the uterine lining to prevent a fertilized egg from implanting there.  Copper IUD. This type of IUD has copper wire wrapped around it. A copper IUD may last up to 10 years. Copper prevents pregnancy by making the uterus and fallopian tubes produce a fluid that kills sperm. Tell a health care provider about:  Any allergies you have.  All medicines you are taking, including vitamins, herbs, eye drops, creams, and over-the-counter medicines.  Any surgeries you have had.  Any medical conditions you  have, including any sexually transmitted infections (STIs) you may have.  Whether you are pregnant or may be pregnant. What are the risks? Generally, this is a safe procedure. However, problems may occur, including:  Infection.  Bleeding.  Allergic reactions to medicines.  Puncture (perforation) of the uterus or damage to other structures or organs.  Accidental placement of the IUD either in the muscle layer of the uterus (myometrium) or outside the uterus.  The IUD falling out of the uterus (expulsion). This is more common among women who have recently had a child.  Higher risk of an egg being fertilized outside your uterus (ectopic pregnancy).This is rare.  Pelvic inflammatory disease  (PID), which is an infection in the uterus and fallopian tubes. The IUD does not cause the infection. The infection is usually from an unknown sexually transmitted infection (STI). This is rare, and it usually happens during the first 20 days after the IUD is inserted. What happens before the procedure?  Ask your health care provider about: ? Changing or stopping your regular medicines. This is especially important if you are taking diabetes medicines or blood thinners. ? Taking over-the-counter medicines, vitamins, herbs, and supplements.  Talk with your health care provider about when to schedule your IUD placement.  Your health care provider may recommend taking over-the-counter pain medicines before the procedure. These medicines include ibuprofen and naproxen.  You may have tests for: ? Pregnancy. A pregnancy test involves having a urine or blood sample taken. ? Sexually transmitted infections (STIs). Placing an IUD in someone who has an STI can make the infection worse. ? Cervical cancer. You may have a Pap test to check for this type of cancer. This means collecting cells from your cervix to be checked under a microscope.  You may have a physical exam to determine the size and position of your uterus. What happens during the procedure?  A tool (speculum) will be placed in your vagina and widened so that your health care provider can see your cervix.  Medicine, or antiseptic, may be applied to your cervix to help lower your risk of infection.  You may be given an anesthetic medicine to numb each side of your cervix. This medicine is usually given by an injection into the cervix.  A tool called a uterine sound will be inserted into your uterus to check the length of your uterus and the direction that your uterus may be tilted.  A slim instrument or tube (IUD inserter) that holds the IUD will be inserted into your vagina, through your cervical canal, and into your uterus.  The IUD  will be placed in the uterus, and the IUD inserter will be removed.  The strings that are attached to the IUD will be trimmed so that they lie just below the cervix.  The speculum will be removed. The procedure may vary among health care providers and hospitals. What can I expect after procedure?  You may have bleeding after the procedure. This is normal. It varies from light bleeding (spotting) for a few days to menstrual-like bleeding.  You may have cramping and pain in the abdomen.  You may feel dizzy or light-headed.  You may have lower back pain.  You may have headaches and nausea. Follow these instructions at home:  Before resuming sexual activity, check to make sure that you can feel the IUD string or strings. You should be able to feel the end of the string below the opening of your cervix. If your  IUD string is in place, you may resume sexual activity. ? If you had a hormonal IUD inserted more than 7 days after your most recent period started, you will need to use a backup method of birth control for 7 days after IUD insertion. Ask your health care provider whether this applies to you.  Continue to check that the IUD is still in place by feeling for the strings after every menstrual period, or once a month.  An IUD will not protect you from sexually transmitted infections (STIs). Use methods to prevent the exchange of body fluids between partners (barrier protection) every time you have sex. Barrier protection can be used during oral, vaginal, or anal sex. Commonly used barrier methods include: ? Female condom. ? Female condom. ? Dental dam.  Take over-the-counter and prescription medicines only as told by your health care provider.  Keep all follow-up visits. This is important. Contact a health care provider if:  You feel light-headed or weak.  You have any of the following problems with your IUD string or strings: ? The string bothers or hurts you or your sexual  partner. ? You cannot feel the string. ? The string has gotten longer.  You can feel the IUD in your vagina.  You think you may be pregnant, or you miss your menstrual period.  You think you may have a sexually transmitted infection (STI). Get help right away if you:  You have flu-like symptoms, such as tiredness (fatigue) and muscle aches.  You have a fever and chills.  You have bleeding that is heavier or lasts longer than a normal menstrual cycle.  You have abnormal or bad-smelling discharge from your vagina.  You develop abdominal pain that is new, is getting worse, or is not in the same area of earlier cramping and pain.  You have pain during sexual activity. Summary  An intrauterine device (IUD) is a small, T-shaped device that has one or two nylon strings hanging down from it. You may have a copper IUD or a hormone IUD.  Ask your health care provider what you need to do before the procedure. You may have some tests and you may have to change or stop some medicines.  You may have bleeding after the procedure. This is normal. It varies from light spotting for a few days to menstrual-like bleeding.  Check to make sure that you can feel the IUD strings before you resume sexual activity. Check the strings after every menstrual period or once a month.  An IUD does not protect against STIs. Use other methods to protect yourself against infections. This information is not intended to replace advice given to you by your health care provider. Make sure you discuss any questions you have with your health care provider. Document Revised: 11/03/2019 Document Reviewed: 11/03/2019 Elsevier Patient Education  [place].

## 2020-06-19 NOTE — Progress Notes (Signed)
GYNECOLOGY OFFICE VISIT NOTE  History:   Brittany Mcknight is a 35 y.o. G3P3003 here today for discussion about alternative birth control. Currently on Norethindrone 0.35 mg daily. Wants something more effective but with less hormones as she feels this is contributing to her weight gain. Considering sterilization, husband refuses to get vasectomy.  She denies any abnormal vaginal discharge, bleeding, pelvic pain or other concerns.    Past Medical History:  Diagnosis Date  . Anxiety   . Depression   . Psoriasis   . Thyroid disease 2016    Past Surgical History:  Procedure Laterality Date  . broken bone repair      The following portions of the patient's history were reviewed and updated as appropriate: allergies, current medications, past family history, past medical history, past social history, past surgical history and problem list.   Health Maintenance:  Normal pap and negative HRHPV on 06/10/2018.  Review of Systems:  Pertinent items noted in HPI and remainder of comprehensive ROS otherwise negative.  Physical Exam:  BP (!) 106/59   Pulse (!) 58   Ht 5\' 4"  (1.626 m)   Wt 140 lb (63.5 kg)   LMP 05/28/2020   BMI 24.03 kg/m  CONSTITUTIONAL: Well-developed, well-nourished female in no acute distress.  HEENT:  Normocephalic, atraumatic. External right and left ear normal. No scleral icterus.  NECK: Normal range of motion, supple, no masses noted on observation SKIN: No rash noted. Not diaphoretic. No erythema. No pallor. MUSCULOSKELETAL: Normal range of motion. No edema noted. NEUROLOGIC: Alert and oriented to person, place, and time. Normal muscle tone coordination. No cranial nerve deficit noted. PSYCHIATRIC: Normal mood and affect. Normal behavior. Normal judgment and thought content. CARDIOVASCULAR: Normal heart rate noted RESPIRATORY: Effort and breath sounds normal, no problems with respiration noted ABDOMEN: No masses noted. No other overt distention noted.    PELVIC: Deferred    Assessment and Plan:    1. Encounter for female birth control Recommended [place] for the least hormonal containing IUD or Paragard. She does not want Paragard, has heard about it being embedded and cramping and heavy bleeding.  Discussed size of Kyleena, less hormones, less chance of being embedded.    Patient also considering bilateral tubal sterilization.   Discussed bilateral tubal sterilization in detail; discussed options of laparoscopic bilateral tubal sterilization using Filshie clips vs laparoscopic bilateral salpingectomy. Risks and benefits discussed in detail including but not limited to: risk of regret, permanence of method, bleeding, infection, injury to surrounding organs and need for additional procedures.  Failure risk of about 1% for Filshie clips and <1% for bilateral salpingectomy with increased risk of ectopic gestation if pregnancy occurs was also discussed with patient.  Also discussed possibility of post-tubal pain syndrome.  Discussed possible reduction of risk of ovarian cancer via bilateral salpingectomy given that a growing body of knowledge reveals that the majority of cases of high grade serous "ovarian" cancer actually are actually  cancers arising from the fimbriated end of the fallopian tubes. Emphasized that removal of fallopian tubes do not result in any known hormonal imbalance.  Patient verbalized understanding of these risks and benefits.  She declined being scheduled for any procedure today, will let us know of her decision soon. Will continue her pills in the meantime.    Routine preventative health maintenance measures emphasized. Please refer to After Visit Summary for other counseling recommendations.   Return for any gynecologic concerns.    I spent 20 minutes dedicated to the care  of this patient including pre-visit review of records, face to face time with the patient discussing her conditions and treatments and post visit ordering of  testing.    Verita Schneiders, MD, [place] for [employer], [place]

## 2020-06-21 ENCOUNTER — Encounter: Payer: Self-pay | Admitting: Physical Therapy

## 2020-06-21 ENCOUNTER — Other Ambulatory Visit: Payer: Self-pay

## 2020-06-21 ENCOUNTER — Ambulatory Visit: Payer: BC Managed Care – PPO | Attending: Osteopathic Medicine | Admitting: Physical Therapy

## 2020-06-21 DIAGNOSIS — M25561 Pain in right knee: Secondary | ICD-10-CM | POA: Insufficient documentation

## 2020-06-21 DIAGNOSIS — R29898 Other symptoms and signs involving the musculoskeletal system: Secondary | ICD-10-CM | POA: Insufficient documentation

## 2020-06-21 DIAGNOSIS — R262 Difficulty in walking, not elsewhere classified: Secondary | ICD-10-CM | POA: Insufficient documentation

## 2020-06-21 NOTE — Patient Instructions (Signed)
    Access Code: [email or web address] URL: [email or web address] Date: 06/21/2020 Prepared by: Annie Paras  Exercises Gastroc Stretch with Foot at [place] 2 x daily - 7 x weekly - 3 reps - 30 sec hold Standing ITB Stretch - 2 x daily - 7 x weekly - 3 reps - 30 sec hold Wall Squat Hold with Ball - 2 x daily - 7 x weekly - 2 sets - 10 reps - 5 sec hold

## 2020-06-21 NOTE — Therapy (Addendum)
[facility] [address]  [place], [place], [address] Phone: [phone]   Fax:  [phone]  Physical Therapy Evaluation / Discharge Summary  Patient Details  Name: Brittany Mcknight MRN: [id number] Date of Birth: December 27, 1985 Referring Provider (PT): Emeterio Reeve, DO   Encounter Date: 06/21/2020   PT End of Session - 06/21/20 0847    Visit Number 1    Number of Visits 8    Date for PT Re-Evaluation 07/19/20    Authorization Type BCBS    PT Start Time 0847    PT Stop Time 0929    PT Time Calculation (min) 42 min    Activity Tolerance Patient tolerated treatment well    Behavior During Therapy [facility] for tasks assessed/performed           Past Medical History:  Diagnosis Date  . Anxiety   . Depression   . Psoriasis   . Thyroid disease 2016    Past Surgical History:  Procedure Laterality Date  . broken bone repair      There were no vitals filed for this visit.    Subjective Assessment - 06/21/20 0849    Subjective PT reports pain in R knee since mid January w/o known MOI. She states sports med thinks she may have a meniscal tear but wants to try bracing and PT before further diagnostic imaging.    Limitations Sitting;Standing;Walking    How long can you walk comfortably? sometimes as soon as 5 minutes    Diagnostic tests R knee x-ray 2/2//22: Difficult to exclude a small joint effusion. Otherwise unremarkable.    Patient Stated Goals "work out more w/o limitaton d/t knee pain"    Currently in Pain? Yes    Pain Score 2    up to 6-7/10 at worst   Pain Location Knee    Pain Orientation Right;Posterior    Pain Descriptors / Indicators Sharp;Dull    Pain Type Acute pain    Pain Onset 1 to 4 weeks ago    Pain Frequency Intermittent    Aggravating Factors  prolonged sitting with knee flexed, running, wearing brace too long    Pain Relieving Factors meloxicam    Effect of Pain on Daily Activities  sitting in criss-cross or with prolonged knee flexion, will occasionally awake her from sleep              [facility] PT Assessment - 06/21/20 0847      Assessment   Medical Diagnosis Chronic R knee pain    Referring Provider (PT) Emeterio Reeve, DO    Onset Date/Surgical Date --   mid-January   Hand Dominance Right    Next MD Visit 07/11/20 - Silverio Decamp, MD    Prior Therapy none      Precautions   Required Braces or Orthoses Other Brace/Splint    Other Brace/Splint knee reaction brace for 1 month      Restrictions   Weight Bearing Restrictions No      Balance Screen   Has the patient fallen in the past 6 months No    Has the patient had a decrease in activity level because of a fear of falling?  No    Is the patient reluctant to leave their home because of a fear of falling?  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   Leisure workout 4x/wk - running, ellipitical, weights      Cognition   Overall Cognitive Status Within Functional Limits for tasks assessed      ROM / Strength   AROM / PROM / Strength AROM;Strength      AROM   AROM Assessment Site Knee    Right/Left Knee Right;Left    Right Knee Extension -4    Right Knee Flexion 145    Left Knee Extension -4    Left Knee Flexion 155      Strength   Overall Strength Comments grossly 4+/5 to 5/5 with R hip ABD mildly weaker and decreased R VMO activation    Strength Assessment Site Hip;Knee;Ankle      Flexibility   Soft Tissue Assessment /Muscle Length yes    Hamstrings WFL but ttp over distal R lateral HS    Quadriceps WNL    ITB mild tight B    Piriformis WFL      Palpation   Palpation comment ttp over distal R lateral HS & ITB, tightness/increased  muscle tension in R gastroc & tib anterior                      Objective measurements completed on examination: See above findings.               PT Education - 06/21/20 0929    Education Details PT eval findings, anticipated POC & initial HEP - Access Code: [email or web address]    Person(s) Educated Patient    Methods Explanation;Demonstration;Verbal cues;Handout    Comprehension Verbalized understanding;Returned demonstration;Need further instruction               PT Long Term Goals - 06/21/20 0929      PT LONG TERM GOAL #1   Title Patient will be independent with ongoing/advanced HEP +/- gym program for self-management at home    Status New    [place] Date 07/19/20      PT LONG TERM GOAL #2   Title Patient to improve R knee AROM to WNL without pain provocation    Status New    [place] Date 07/19/20      PT LONG TERM GOAL #3   Title Patient will demonstrate improved R LE strength to 5/5 for improved stability and ease of mobility    Status New    [place] Date 07/19/20      PT LONG TERM GOAL #4   Title Patient to report ability to perform ADLs, household, and childcare tasks without increased R knee pain    Status New    [place] Date 07/19/20      PT LONG TERM GOAL #5   Title Patient to return to working out w/o limitation due to R knee pain    Status New    [place] Date 07/19/20                  Plan - 06/21/20 0929    Clinical Impression Statement Brittany Mcknight is a 35 y/o female who presents to OP PT for acute on chronic R knee pain originating in mid-January w/o known MOI. She has previously been treated by sports med for B patellofemoral pain syndrome, but states MD thinks she may have  a meniscal tear due to positive McMurray's sign. MD wants to try conservative treatment with reaction knee brace, meniscal rehab exercises and meloxicam. Deficits include R hip knee pain, mildly decreased R knee flexion ROM, increased ttp over R distal lateral HS and ITB  with mild tightness in B ITB and limited activity tolerance. Pain limits sitting tolerance in certain positions, walking and exercise tolerance. Brittany Mcknight will benefit from skilled PT to address above deficits to reduce or eliminate R knee pain and restore functional R LE ROM and strength to allow resumption of normal daily activity and exercise.    Personal Factors and Comorbidities Time since onset of injury/illness/exacerbation;Past/Current Experience;Comorbidity 3+    Comorbidities Thyroid disease, anxiety, depression, psoriasis    Examination-Activity Limitations Bend;Caring for Others;Locomotion Level;Sit;Sleep;Squat;Stand;Stairs    Examination-Participation Restrictions Cleaning;Community Activity;Driving;Interpersonal Relationship;Laundry;Meal Prep   Gym workout   Stability/Clinical Decision Making Stable/Uncomplicated    Clinical Decision Making Low    Rehab Potential Good    PT Frequency 2x / week    PT Duration 4 weeks    PT Treatment/Interventions ADLs/Self Care Home Management;Cryotherapy;Electrical Stimulation;Iontophoresis 37m/ml Dexamethasone;Moist Heat;Ultrasound;Gait training;Stair training;Functional mobility training;Therapeutic activities;Therapeutic exercise;Balance training;Neuromuscular re-education;Patient/family education;Manual techniques;Passive range of motion;Dry needling;Taping;Vasopneumatic Device    PT Next Visit Plan Review initial HEP; meniscal rehab exercises    PT Home Exercise Plan MedBridge Access Code: [email or web address])    Consulted and Agree with Plan of Care Patient           Patient will benefit from skilled therapeutic intervention in order to improve the following deficits and impairments:  Decreased activity tolerance,Decreased range of motion,Decreased strength,Difficulty walking,Increased fascial restricitons,Increased muscle spasms,Impaired perceived functional ability,Impaired flexibility,Pain  Visit Diagnosis: Acute pain of right knee  Difficulty  in walking, not elsewhere classified  Other symptoms and signs involving the musculoskeletal system     Problem List Patient Active Problem List   Diagnosis Date Noted  . Anxiety and depression 07/06/2019  . Patellofemoral pain syndrome of both knees 02/08/2016  . Psoriasis 05/26/2015    JPercival Spanish PT, MPT 06/21/2020, 1:22 PM  [facility] [place] [address]Phone: [phone]  Fax:  [phone] Name: JJAYDI BRAYMRN: [id number] of Birth: 1Jan 06, 1987  PHYSICAL THERAPY DISCHARGE SUMMARY  Visits from Start of Care: 1  Current functional level related to goals / functional outcomes:   Refer to above clinical impression for status as of last visit on 06/21/2020. Patient cancelled all scheduled treatment appointments stating she wanted to discuss the cost with her husband and failed to reschedule within 30 days, therefore will proceed with discharge from PT for this episode.   Remaining deficits:   As above as pt failed to return to PT.   Education / Equipment:   Initial HEP  Plan: Patient agrees to discharge.  Patient goals were not met. Patient is being discharged due to not returning since the last visit.  ?????     JPercival Spanish PT, MPT 08/21/20, 11:56 AM  [facility] [place] [address]Phone: [phone]  Fax:  [phone]

## 2020-06-26 ENCOUNTER — Ambulatory Visit: Payer: BC Managed Care – PPO | Admitting: Physical Therapy

## 2020-06-28 ENCOUNTER — Other Ambulatory Visit: Payer: Self-pay | Admitting: Osteopathic Medicine

## 2020-06-28 ENCOUNTER — Encounter: Payer: Medicaid Other | Admitting: Physical Therapy

## 2020-07-10 ENCOUNTER — Encounter: Payer: Medicaid Other | Admitting: Physical Therapy

## 2020-07-11 ENCOUNTER — Ambulatory Visit (INDEPENDENT_AMBULATORY_CARE_PROVIDER_SITE_OTHER): Payer: BC Managed Care – PPO | Admitting: Sports Medicine

## 2020-07-11 ENCOUNTER — Other Ambulatory Visit: Payer: Self-pay

## 2020-07-11 DIAGNOSIS — M222X1 Patellofemoral disorders, right knee: Secondary | ICD-10-CM

## 2020-07-11 DIAGNOSIS — M222X2 Patellofemoral disorders, left knee: Secondary | ICD-10-CM

## 2020-07-11 NOTE — Assessment & Plan Note (Signed)
Brittany Mcknight is a very pleasant 35 year old female, being seen in our couple of times for knee pain, clinically this resembled patellofemoral and meniscal symptoms, she responded really well to conservative treatment and a reaction knee brace, pain is most part gone, return as needed.

## 2020-07-11 NOTE — Progress Notes (Signed)
    Procedures performed today:    None.  Independent interpretation of notes and tests performed by another provider:   None.  Brief History, Exam, Impression, and Recommendations:    Patellofemoral pain syndrome of both knees Brittany Mcknight is a very pleasant 35 year old female, being seen in our couple of times for knee pain, clinically this resembled patellofemoral and meniscal symptoms, she responded really well to conservative treatment and a reaction knee brace, pain is most part gone, return as needed.    ___________________________________________ Gwen Her. Dianah Field, M.D., ABFM., CAQSM. Primary Care and [place] Instructor of [place] [employer] [facility] of Medicine

## 2020-07-20 ENCOUNTER — Encounter: Payer: Medicaid Other | Admitting: Physical Therapy

## 2020-08-10 ENCOUNTER — Telehealth: Payer: Self-pay

## 2020-08-10 NOTE — Telephone Encounter (Signed)
Pt called requesting whether she can drop off her work clearance form from employer. Pt informed to upload the form or stop by the office to drop off.

## 2020-08-14 ENCOUNTER — Encounter: Payer: Self-pay | Admitting: Osteopathic Medicine

## 2020-08-14 ENCOUNTER — Telehealth: Payer: Self-pay

## 2020-08-14 NOTE — Telephone Encounter (Signed)
Pt dropped off form for the [facility] Department Physician Statement Form. She asked that the Doctor fill this out and would like to be informed when it's completed and that she also sent another form via mychart as well. Please inform the pt via mychart and phone call when the paperwork is completed.. Paperwork left in message box- tvt

## 2020-08-15 NOTE — Telephone Encounter (Signed)
Patient calling in stating that she really needs this form completed by today/tomorrow the latest. States that they will not let her take this test without this form. Please advise.

## 2020-08-16 NOTE — Telephone Encounter (Signed)
See other note, done

## 2020-08-17 NOTE — Telephone Encounter (Signed)
Per message in [place] - patient was informed that forms have been completed. Available to pick these up at pt's convenience.

## 2020-08-21 NOTE — Telephone Encounter (Signed)
Patient did come get form already.

## 2020-09-22 ENCOUNTER — Other Ambulatory Visit: Payer: Self-pay | Admitting: Osteopathic Medicine

## 2020-09-22 DIAGNOSIS — Z3041 Encounter for surveillance of contraceptive pills: Secondary | ICD-10-CM

## 2020-11-03 ENCOUNTER — Other Ambulatory Visit: Payer: Self-pay | Admitting: Osteopathic Medicine

## 2020-12-06 ENCOUNTER — Other Ambulatory Visit: Payer: Self-pay | Admitting: Osteopathic Medicine

## 2020-12-06 DIAGNOSIS — Z3041 Encounter for surveillance of contraceptive pills: Secondary | ICD-10-CM

## 2021-02-26 ENCOUNTER — Other Ambulatory Visit: Payer: Self-pay | Admitting: Osteopathic Medicine

## 2021-04-09 ENCOUNTER — Encounter: Payer: BC Managed Care – PPO | Admitting: Family Medicine

## 2021-04-17 ENCOUNTER — Ambulatory Visit (INDEPENDENT_AMBULATORY_CARE_PROVIDER_SITE_OTHER): Payer: BC Managed Care – PPO | Admitting: Family Medicine

## 2021-04-17 ENCOUNTER — Other Ambulatory Visit: Payer: Self-pay

## 2021-04-17 ENCOUNTER — Encounter: Payer: Self-pay | Admitting: Family Medicine

## 2021-04-17 VITALS — BP 104/68 | HR 73 | Temp 98.0°F | Ht 64.0 in | Wt 132.1 lb

## 2021-04-17 DIAGNOSIS — Z23 Encounter for immunization: Secondary | ICD-10-CM | POA: Diagnosis not present

## 2021-04-17 DIAGNOSIS — Z0000 Encounter for general adult medical examination without abnormal findings: Secondary | ICD-10-CM | POA: Diagnosis not present

## 2021-04-17 LAB — POCT URINALYSIS DIP (CLINITEK)
Bilirubin, UA: NEGATIVE
Blood, UA: NEGATIVE
Glucose, UA: NEGATIVE mg/dL
Ketones, POC UA: NEGATIVE mg/dL
Leukocytes, UA: NEGATIVE
Nitrite, UA: NEGATIVE
POC PROTEIN,UA: NEGATIVE
Spec Grav, UA: 1.025 (ref 1.010–1.025)
Urobilinogen, UA: 0.2 E.U./dL
pH, UA: 7.5 (ref 5.0–8.0)

## 2021-04-17 NOTE — Progress Notes (Signed)
BP 104/68 (BP Location: Left Arm, Patient Position: Sitting, Cuff Size: Normal)    Pulse 73    Temp 98 F (36.7 C) (Oral)    Ht 5\' 4"  (1.626 m)    Wt 132 lb 1.3 oz (59.9 kg)    SpO2 100%    BMI 22.67 kg/m    Subjective:    Patient ID: Brittany Mcknight, female    DOB: 01-25-1986, 35 y.o.   MRN: [id number]  HPI: Brittany Mcknight is a 34 y.o. female presenting on 04/17/2021 for comprehensive medical examination. Current medical complaints include:  none  She currently lives with: husband, kids, pets Interim Problems from her last visit: no   She reports regular vision exams q1-5y: yes She reports regular dental exams q 59m: yes Her diet consists of: regular, tries to eat healthy She endorses exercise and/or activity of: very active at work, lifting, walking, cardio  She works at: [employer]   She denies ETOH use. She denies nictoine use. She denies illegal substance use.    She reports regular menstrual periods with average flow. Current menopausal symptoms: no She is currently  sexually active  She denies  concerns today about STI Contraception choices are: pills   She denies concerns about skin changes today. She denies concerns about bowel changes today. She denies concerns about bladder changes today.  Depression Screen done today and results listed below:  Depression screen [facility] 2/9 04/17/2021 08/03/2019 07/06/2019 06/07/2019  Decreased Interest 0 1 2 2   Down, Depressed, Hopeless 0 0 1 2  PHQ - 2 Score 0 1 3 4   Altered sleeping - 2 1 0  Tired, decreased energy - 1 1 0  Change in appetite - 0 0 0  Feeling bad or failure about yourself  - 1 0 1  Trouble concentrating - 1 1 0  Moving slowly or fidgety/restless - 0 0 0  Suicidal thoughts - 0 0 0  PHQ-9 Score - 6 6 5   Difficult doing work/chores - Somewhat difficult Not difficult at all Somewhat difficult    She does not have a history of falls.       Past Medical History:  Past Medical History:  Diagnosis Date    Anxiety    Depression    Psoriasis    Thyroid disease 2016    Surgical History:  Past Surgical History:  Procedure Laterality Date   broken bone repair      Medications:  Current Outpatient Medications on File Prior to Visit  Medication Sig   busPIRone (BUSPAR) 5 MG tablet Take 1-2 tablets (5-10 mg total) by mouth 2 (two) times daily. Needs appointment.   Clobetasol Propionate 0.05 % lotion Apply topically as needed.   HUMIRA 40 MG/0.4ML PSKT Inject 80 mg as directed.   meloxicam (MOBIC) 15 MG tablet One tab PO qAM with a meal for 2 weeks, then daily prn pain.   NORA-BE 0.35 MG tablet TAKE 1 TABLET BY MOUTH EVERY DAY   sertraline (ZOLOFT) 100 MG tablet Take 1.5 tablets (150 mg total) by mouth daily. NO REFILLS. NEEDS TO TRANSITION CARE TO NEW PCP.   No current facility-administered medications on file prior to visit.    Allergies:  Allergies  Allergen Reactions   Sumatriptan Other (See Comments)    Visual changes and severe headache     Social History:  Social History   Socioeconomic History   Marital status: Married    Spouse name: Not on file   Number  of children: 3   Years of education: Not on file   Highest education level: Not on file  Occupational History   Not on file  Tobacco Use   Smoking status: Never   Smokeless tobacco: Never   Tobacco comments:    Patient quit 2005  Vaping Use   Vaping Use: Never used  Substance and Sexual Activity   Alcohol use: No    Alcohol/week: 0.0 standard drinks   Drug use: No   Sexual activity: Yes    Partners: Male    Birth control/protection: Pill  Other Topics Concern   Not on file  Social History Narrative   Not on file   Social Determinants of Health   Financial Resource Strain: Not on file  Food Insecurity: Not on file  Transportation Needs: Not on file  Physical Activity: Not on file  Stress: Not on file  Social Connections: Not on file  Intimate Partner Violence: Not on file   Social History    Tobacco Use  Smoking Status Never  Smokeless Tobacco Never  Tobacco Comments   Patient quit 2005   Social History   Substance and Sexual Activity  Alcohol Use No   Alcohol/week: 0.0 standard drinks    Family History:  Family History  Problem Relation Age of Onset   Depression Father    Cancer Mother        PANCREATIC   Diabetes Maternal Grandfather    Stroke Maternal Grandfather    Diabetes Paternal Grandfather     Past medical history, surgical history, medications, allergies, family history and social history reviewed with patient today and changes made to appropriate areas of the chart.   All ROS negative except what is listed above and in the HPI.      Objective:    BP 104/68 (BP Location: Left Arm, Patient Position: Sitting, Cuff Size: Normal)    Pulse 73    Temp 98 F (36.7 C) (Oral)    Ht 5\' 4"  (1.626 m)    Wt 132 lb 1.3 oz (59.9 kg)    SpO2 100%    BMI 22.67 kg/m   Wt Readings from Last 3 Encounters:  04/17/21 132 lb 1.3 oz (59.9 kg)  06/19/20 140 lb (63.5 kg)  06/07/20 139 lb (63 kg)    Physical Exam Vitals reviewed.  Constitutional:      Appearance: Normal appearance. She is normal weight.  HENT:     Head: Normocephalic and atraumatic.     Right Ear: Tympanic membrane normal.     Left Ear: Tympanic membrane normal.     Nose: Nose normal.     Mouth/Throat:     Mouth: Mucous membranes are moist.     Pharynx: Oropharynx is clear.  Eyes:     Conjunctiva/sclera: Conjunctivae normal.  Cardiovascular:     Rate and Rhythm: Normal rate and regular rhythm.     Pulses: Normal pulses.     Heart sounds: Normal heart sounds.  Pulmonary:     Effort: Pulmonary effort is normal.     Breath sounds: Normal breath sounds.  Abdominal:     General: Abdomen is flat. Bowel sounds are normal. There is no distension.     Palpations: Abdomen is soft. There is no mass.     Tenderness: There is no abdominal tenderness. There is no guarding or rebound.     Hernia: No  hernia is present.  Musculoskeletal:        General: Normal range of motion.  Cervical back: Normal range of motion and neck supple.  Skin:    General: Skin is warm and dry.     Capillary Refill: Capillary refill takes less than 2 seconds.     Comments: Maculopapular rash to bilateral hands/fingers, no signs of infection, encouraged hydrocortisone cream trial   Neurological:     General: No focal deficit present.     Mental Status: She is alert and oriented to person, place, and time. Mental status is at baseline.  Psychiatric:        Mood and Affect: Mood normal.        Behavior: Behavior normal.        Thought Content: Thought content normal.        Judgment: Judgment normal.    Results for orders placed or performed in visit on 06/07/20  CBC  Result Value Ref Range   WBC 9.4 3.8 - 10.8 Thousand/uL   RBC 4.77 3.80 - 5.10 Million/uL   Hemoglobin 14.0 11.7 - 15.5 g/dL   HCT 42.0 35.0 - 45.0 %   MCV 88.1 80.0 - 100.0 fL   MCH 29.4 27.0 - 33.0 pg   MCHC 33.3 32.0 - 36.0 g/dL   RDW 12.0 11.0 - 15.0 %   Platelets 321 140 - 400 Thousand/uL   MPV 9.5 7.5 - 12.5 fL  COMPLETE METABOLIC PANEL WITH GFR  Result Value Ref Range   Glucose, Bld 68 65 - 139 mg/dL   BUN 12 7 - 25 mg/dL   Creat 0.74 0.50 - 1.10 mg/dL   GFR, Est Non African American 106 > OR = 60 mL/min/1.73m2   GFR, Est African American 123 > OR = 60 mL/min/1.61m2   BUN/Creatinine Ratio NOT APPLICABLE 6 - 22 (calc)   Sodium 141 135 - 146 mmol/L   Potassium 4.0 3.5 - 5.3 mmol/L   Chloride 105 98 - 110 mmol/L   CO2 30 20 - 32 mmol/L   Calcium 9.3 8.6 - 10.2 mg/dL   Total Protein 6.6 6.1 - 8.1 g/dL   Albumin 4.4 3.6 - 5.1 g/dL   Globulin 2.2 1.9 - 3.7 g/dL (calc)   AG Ratio 2.0 1.0 - 2.5 (calc)   Total Bilirubin 0.6 0.2 - 1.2 mg/dL   Alkaline phosphatase (APISO) 53 31 - 125 U/L   AST 15 10 - 30 U/L   ALT 11 6 - 29 U/L  TSH  Result Value Ref Range   TSH 0.93 mIU/L      Assessment & Plan:   Problem List Items  Addressed This Visit   None Visit Diagnoses     Need for influenza vaccination    -  Primary   Relevant Orders   Flu Vaccine QUAD 6+ mos PF IM (Fluarix Quad PF) (Completed)   Annual physical exam       Relevant Orders   POCT URINALYSIS DIP (CLINITEK)          LABORATORY TESTING:  - Health maintenance labs ordered today as discussed above.           Patient deferred labs today; UA requested for work form - STI testing: deferred - Pap smear: done elsewhere     IMMUNIZATIONS:   - Tdap: Tetanus vaccination status reviewed: last tetanus booster within 10 years. - Influenza: Administered today - Pneumovax: Not applicable - Prevnar: Not applicable - HPV: Not applicable - Shingrix vaccine: Not applicable - MGNOI-[id number]: Up to date  SCREENING: - Mammogram: Not applicable - Bone Density: Not applicable -  Colonoscopy: Not applicable  - AAA Screening: Not applicable  -Hearing Test: Not applicable -Spirometry: Not applicable  - Lung Cancer Screening: Not applicable    PATIENT COUNSELING:   Advised to take 1 mg of folate supplement per day if capable of pregnancy.   Sexuality: Discussed sexually transmitted diseases, partner selection, use of condoms, avoidance of unintended pregnancy, and contraceptive alternatives.    I discussed with the patient that most people either abstain from alcohol or drink within safe limits (<=14/week and <=4 drinks/occasion for males, <=7/weeks and <= 3 drinks/occasion for females) and that the risk for alcohol disorders and other health effects rises proportionally with the number of drinks per week and how often a drinker exceeds daily limits.  Discussed cessation/primary prevention of drug use and availability of treatment for abuse.   Diet: Encouraged to adjust caloric intake to maintain or achieve ideal body weight, to reduce intake of dietary saturated fat and total fat, to limit sodium intake by avoiding high sodium foods and not adding table  salt, and to maintain adequate dietary potassium and calcium preferably from fresh fruits, vegetables, and low-fat dairy products. Encouraged vitamin D 1000 units and Calcium 1300mg  or 4 servings of dairy a day.  Emphasized the importance of regular exercise.  Injury prevention: Discussed safety belts, safety helmets, smoke detector, smoking near bedding or upholstery.   Dental health: Discussed importance of regular tooth brushing, flossing, and dental visits.  Follow up plan:  Return in about 6 months (around 10/16/2021) for establish with new provider.   Purcell Nails Olevia Bowens, DNP, FNP-C

## 2021-04-18 ENCOUNTER — Other Ambulatory Visit: Payer: Self-pay | Admitting: Osteopathic Medicine

## 2021-04-18 DIAGNOSIS — Z3041 Encounter for surveillance of contraceptive pills: Secondary | ICD-10-CM

## 2021-05-19 ENCOUNTER — Other Ambulatory Visit: Payer: Self-pay | Admitting: Medical-Surgical

## 2021-05-23 ENCOUNTER — Other Ambulatory Visit: Payer: Self-pay | Admitting: Osteopathic Medicine

## 2021-06-14 ENCOUNTER — Other Ambulatory Visit: Payer: Self-pay | Admitting: Medical-Surgical

## 2021-06-14 NOTE — Telephone Encounter (Signed)
LVM for patient to call back to get TOC appt scheduled .

## 2021-06-14 NOTE — Telephone Encounter (Signed)
Please schedule for appt to establish with new provider

## 2021-09-06 ENCOUNTER — Other Ambulatory Visit: Payer: Self-pay | Admitting: Family Medicine

## 2021-09-14 ENCOUNTER — Ambulatory Visit (INDEPENDENT_AMBULATORY_CARE_PROVIDER_SITE_OTHER): Payer: BC Managed Care – PPO | Admitting: Sports Medicine

## 2021-09-14 ENCOUNTER — Encounter: Payer: Self-pay | Admitting: Sports Medicine

## 2021-09-14 DIAGNOSIS — Z20818 Contact with and (suspected) exposure to other bacterial communicable diseases: Secondary | ICD-10-CM | POA: Diagnosis not present

## 2021-09-14 MED ORDER — AMOXICILLIN 500 MG PO CAPS: 500.0000 mg | ORAL_CAPSULE | Freq: Two times a day (BID) | ORAL | 0 refills | Status: AC

## 2021-09-14 NOTE — Assessment & Plan Note (Signed)
Pleasant 36 year old female, she is immunocompromised on Humira for psoriasis, her child was recently diagnosed with streptococcal tonsillopharyngitis and treated with antibiotics, just finished antibiotics, unfortunately Umi is also started to feel sick with a sore throat and cough. ?No rashes, no GI symptoms, no fevers or chills, exam is unrevealing with the exception of pharyngeal erythema. ?We will go ahead and treat her with amoxicillin as she is high risk for complications. ?Return to see me as needed. ?She can call me if she gets a yeast infection. ?

## 2021-09-14 NOTE — Progress Notes (Signed)
? ? ?  Procedures performed today:   ? ?None. ? ?Independent interpretation of notes and tests performed by another provider:  ? ?None. ? ?Brief History, Exam, Impression, and Recommendations:   ? ?Exposure to strep throat ?Pleasant 36 year old female, she is immunocompromised on Humira for psoriasis, her child was recently diagnosed with streptococcal tonsillopharyngitis and treated with antibiotics, just finished antibiotics, unfortunately Brittany Mcknight is also started to feel sick with a sore throat and cough. ?No rashes, no GI symptoms, no fevers or chills, exam is unrevealing with the exception of pharyngeal erythema. ?We will go ahead and treat her with amoxicillin as she is high risk for complications. ?Return to see me as needed. ?She can call me if she gets a yeast infection. ? ? ? ?___________________________________________ ?Brittany Mcknight, M.D., ABFM., CAQSM. ?Primary Care and Sports Medicine ?[place] ? ?Adjunct Instructor of Family Medicine  ?[employer] ?

## 2021-09-25 ENCOUNTER — Other Ambulatory Visit: Payer: Self-pay | Admitting: Family Medicine

## 2021-10-03 ENCOUNTER — Other Ambulatory Visit: Payer: Self-pay | Admitting: Medical-Surgical

## 2021-10-08 ENCOUNTER — Telehealth: Payer: Self-pay | Admitting: Medical-Surgical

## 2021-10-08 NOTE — Telephone Encounter (Signed)
Patient has a transfer of care scheduled for 7/25. She wants to know if she can get a refill of her Zoloft until her appt.

## 2021-10-09 MED ORDER — SERTRALINE HCL 100 MG PO TABS: 150.0000 mg | ORAL_TABLET | Freq: Every day | ORAL | 0 refills | Status: DC

## 2021-10-09 NOTE — Telephone Encounter (Signed)
I sent this refill to the [facility] in [place].

## 2021-10-09 NOTE — Telephone Encounter (Signed)
LVM

## 2021-10-15 ENCOUNTER — Other Ambulatory Visit: Payer: Self-pay

## 2021-10-15 ENCOUNTER — Other Ambulatory Visit: Payer: Self-pay | Admitting: Family Medicine

## 2021-10-15 DIAGNOSIS — Z3041 Encounter for surveillance of contraceptive pills: Secondary | ICD-10-CM

## 2021-10-15 MED ORDER — NORETHINDRONE 0.35 MG PO TABS: 1.0000 | ORAL_TABLET | Freq: Every day | ORAL | 0 refills | Status: DC

## 2021-10-16 ENCOUNTER — Other Ambulatory Visit: Payer: Self-pay | Admitting: Medical-Surgical

## 2021-11-26 DIAGNOSIS — Z3041 Encounter for surveillance of contraceptive pills: Secondary | ICD-10-CM | POA: Insufficient documentation

## 2021-11-26 NOTE — Progress Notes (Unsigned)
   Established Patient Office Visit  Subjective   Patient ID: Brittany Mcknight, female   DOB: March 05, 1986 Age: 36 y.o. MRN: [id number]   No chief complaint on file.   HPI Pleasant 36 year old female presenting today to transfer care to a new PCP and for the following:  Birth control:  Anxiety/depression:  Patellofemoral syndrome:   Objective:    There were no vitals filed for this visit.  Physical Exam   No results found for this or any previous visit (from the past 24 hour(s)).   {Labs (Optional):23779}  The ASCVD Risk score (Arnett DK, et al., 2019) failed to calculate for the following reasons:   The 2019 ASCVD risk score is only valid for ages 31 to 91   Assessment & Plan:   No problem-specific Assessment & Plan notes found for this encounter.   No follow-ups on file.  ___________________________________________ Clearnce Sorrel, DNP, APRN, FNP-BC Primary [facility] and [place]

## 2021-11-27 ENCOUNTER — Ambulatory Visit: Payer: BC Managed Care – PPO | Admitting: Medical-Surgical

## 2021-11-27 VITALS — BP 112/80 | HR 75 | Ht 64.0 in | Wt 140.0 lb

## 2021-11-27 DIAGNOSIS — M222X1 Patellofemoral disorders, right knee: Secondary | ICD-10-CM

## 2021-11-27 DIAGNOSIS — F419 Anxiety disorder, unspecified: Secondary | ICD-10-CM | POA: Diagnosis not present

## 2021-11-27 DIAGNOSIS — M222X2 Patellofemoral disorders, left knee: Secondary | ICD-10-CM

## 2021-11-27 DIAGNOSIS — R635 Abnormal weight gain: Secondary | ICD-10-CM

## 2021-11-27 DIAGNOSIS — F32A Depression, unspecified: Secondary | ICD-10-CM

## 2021-11-27 DIAGNOSIS — Z3041 Encounter for surveillance of contraceptive pills: Secondary | ICD-10-CM

## 2021-11-27 DIAGNOSIS — Z0000 Encounter for general adult medical examination without abnormal findings: Secondary | ICD-10-CM

## 2021-11-27 DIAGNOSIS — Z7689 Persons encountering health services in other specified circumstances: Secondary | ICD-10-CM | POA: Diagnosis not present

## 2021-11-27 MED ORDER — BUSPIRONE HCL 5 MG PO TABS: 5.0000 mg | ORAL_TABLET | Freq: Two times a day (BID) | ORAL | 2 refills | Status: DC

## 2021-11-27 MED ORDER — NORETHINDRONE 0.35 MG PO TABS: 1.0000 | ORAL_TABLET | Freq: Every day | ORAL | 3 refills | Status: DC

## 2021-11-27 MED ORDER — SERTRALINE HCL 100 MG PO TABS: 150.0000 mg | ORAL_TABLET | Freq: Every day | ORAL | 3 refills | Status: DC

## 2021-12-24 ENCOUNTER — Encounter: Payer: Self-pay | Admitting: Medical-Surgical

## 2022-02-14 DIAGNOSIS — L408 Other psoriasis: Secondary | ICD-10-CM | POA: Diagnosis not present

## 2022-12-02 ENCOUNTER — Other Ambulatory Visit: Payer: Self-pay | Admitting: Medical-Surgical

## 2022-12-10 ENCOUNTER — Telehealth: Payer: Self-pay | Admitting: Medical-Surgical

## 2022-12-10 NOTE — Telephone Encounter (Signed)
Patient called requesting a refill of ; sertraline (ZOLOFT) 100 MG tablet [[id number]]   Pharmacy ;   [phone] - [place], [place] - [address]

## 2022-12-12 NOTE — Telephone Encounter (Signed)
Thank you :)

## 2022-12-13 ENCOUNTER — Ambulatory Visit: Payer: BC Managed Care – PPO | Admitting: Medical-Surgical

## 2022-12-13 ENCOUNTER — Encounter: Payer: Self-pay | Admitting: Medical-Surgical

## 2022-12-13 VITALS — BP 106/59 | HR 63 | Ht 64.0 in | Wt 144.0 lb

## 2022-12-13 DIAGNOSIS — F419 Anxiety disorder, unspecified: Secondary | ICD-10-CM | POA: Diagnosis not present

## 2022-12-13 DIAGNOSIS — E0590 Thyrotoxicosis, unspecified without thyrotoxic crisis or storm: Secondary | ICD-10-CM | POA: Insufficient documentation

## 2022-12-13 DIAGNOSIS — F32A Depression, unspecified: Secondary | ICD-10-CM

## 2022-12-13 MED ORDER — BUSPIRONE HCL 5 MG PO TABS: 5.0000 mg | ORAL_TABLET | Freq: Two times a day (BID) | ORAL | 3 refills | Status: DC

## 2022-12-13 MED ORDER — SERTRALINE HCL 100 MG PO TABS: 150.0000 mg | ORAL_TABLET | Freq: Every day | ORAL | 3 refills | Status: DC

## 2022-12-13 NOTE — Progress Notes (Signed)
        Established patient visit  History, exam, impression, and plan:  1. Anxiety and depression Pleasant 37 year old female presenting today for follow-up on anxiety and depression.  Has been taking Zoloft 150 mg daily, tolerating well without side effects.  Has been treated with this long-term and feels that the medication continues to work well for her.  Using BuSpar 5-10 mg twice daily as needed which also helps with her symptoms.  Has been out of Zoloft for the last week due to need for follow-up.  Has been using the BuSpar more frequently than usual during this past week.  Denies SI/HI.  Resume Zoloft as prescribed.  Refilling BuSpar.  Procedures performed this visit: None.  Return in about 1 year (around 12/13/2023) for mood follow up.  __________________________________ Thayer Ohm, DNP, APRN, FNP-BC Primary Care and Sports Medicine [facility]

## 2023-02-03 DIAGNOSIS — F411 Generalized anxiety disorder: Secondary | ICD-10-CM | POA: Diagnosis not present

## 2023-02-12 DIAGNOSIS — F411 Generalized anxiety disorder: Secondary | ICD-10-CM | POA: Diagnosis not present

## 2023-02-19 DIAGNOSIS — F411 Generalized anxiety disorder: Secondary | ICD-10-CM | POA: Diagnosis not present

## 2023-02-26 DIAGNOSIS — F411 Generalized anxiety disorder: Secondary | ICD-10-CM | POA: Diagnosis not present

## 2023-03-24 DIAGNOSIS — F411 Generalized anxiety disorder: Secondary | ICD-10-CM | POA: Diagnosis not present

## 2023-04-01 DIAGNOSIS — F411 Generalized anxiety disorder: Secondary | ICD-10-CM | POA: Diagnosis not present

## 2023-04-07 DIAGNOSIS — F411 Generalized anxiety disorder: Secondary | ICD-10-CM | POA: Diagnosis not present

## 2023-04-17 DIAGNOSIS — F411 Generalized anxiety disorder: Secondary | ICD-10-CM | POA: Diagnosis not present

## 2023-04-24 DIAGNOSIS — F411 Generalized anxiety disorder: Secondary | ICD-10-CM | POA: Diagnosis not present

## 2023-05-09 DIAGNOSIS — F411 Generalized anxiety disorder: Secondary | ICD-10-CM | POA: Diagnosis not present

## 2023-05-19 DIAGNOSIS — F411 Generalized anxiety disorder: Secondary | ICD-10-CM | POA: Diagnosis not present

## 2023-05-27 DIAGNOSIS — F411 Generalized anxiety disorder: Secondary | ICD-10-CM | POA: Diagnosis not present

## 2023-06-03 ENCOUNTER — Ambulatory Visit (INDEPENDENT_AMBULATORY_CARE_PROVIDER_SITE_OTHER): Payer: BC Managed Care – PPO | Admitting: Medical-Surgical

## 2023-06-03 ENCOUNTER — Encounter: Payer: Self-pay | Admitting: Medical-Surgical

## 2023-06-03 VITALS — BP 100/59 | HR 64 | Resp 20 | Ht 64.0 in | Wt 131.3 lb

## 2023-06-03 DIAGNOSIS — F419 Anxiety disorder, unspecified: Secondary | ICD-10-CM | POA: Diagnosis not present

## 2023-06-03 DIAGNOSIS — Z0000 Encounter for general adult medical examination without abnormal findings: Secondary | ICD-10-CM | POA: Insufficient documentation

## 2023-06-03 DIAGNOSIS — E0590 Thyrotoxicosis, unspecified without thyrotoxic crisis or storm: Secondary | ICD-10-CM

## 2023-06-03 DIAGNOSIS — F32A Depression, unspecified: Secondary | ICD-10-CM

## 2023-06-03 DIAGNOSIS — Z23 Encounter for immunization: Secondary | ICD-10-CM | POA: Insufficient documentation

## 2023-06-03 DIAGNOSIS — Z308 Encounter for other contraceptive management: Secondary | ICD-10-CM | POA: Diagnosis not present

## 2023-06-03 LAB — POCT URINE PREGNANCY: Preg Test, Ur: NEGATIVE

## 2023-06-03 MED ORDER — NORA-BE 0.35 MG PO TABS: 1.0000 | ORAL_TABLET | Freq: Every day | ORAL | 4 refills | Status: DC

## 2023-06-03 NOTE — Patient Instructions (Signed)

## 2023-06-03 NOTE — Progress Notes (Signed)
Annual Wellness Visit     Patient: Brittany Mcknight, Female    DOB: 07/01/1985, 38 y.o.   MRN: [id number]  Subjective  Chief Complaint  Patient presents with   Annual Exam   Contraception    DISCUSS    Brittany Mcknight is a 38 y.o. female who presents today for her Annual Wellness Visit. She reports consuming a general diet. Exercise is limited by schedule. She generally feels well. She reports sleeping well. She does have additional problems to discuss today and would liek to restart NORA-BE oral contraception.  HPI  Vision: 2 years ago, Dental: No current dental problems, and STD: NA   Patient Active Problem List   Diagnosis Date Noted   Annual physical exam 06/03/2023   Need for influenza vaccination 06/03/2023   Hyperthyroidism 12/13/2022   Postpartum state 12/13/2022   Encounter for surveillance of contraceptive pills 11/26/2021   Anxiety and depression 07/06/2019   Cervical cancer screening 09/18/2018   Anxiety 06/02/2018   Migraine without aura 06/02/2018   History of postpartum depression, currently pregnant 01/22/2018   Pregnancy 01/21/2018   Patellofemoral pain syndrome of both knees 02/08/2016   Psoriasis 05/26/2015   Past Medical History:  Diagnosis Date   Anxiety    Depression    Psoriasis    Thyroid disease 2016   Past Surgical History:  Procedure Laterality Date   broken bone repair     Social History   Tobacco Use   Smoking status: Never   Smokeless tobacco: Never   Tobacco comments:    Patient quit 2005  Vaping Use   Vaping status: Never Used  Substance Use Topics   Alcohol use: No    Alcohol/week: 0.0 standard drinks of alcohol   Drug use: No   Social History   Socioeconomic History   Marital status: Married    Spouse name: Not on file   Number of children: 3   Years of education: Not on file   Highest education level: Not on file  Occupational History   Not on file  Tobacco Use   Smoking status: Never   Smokeless  tobacco: Never   Tobacco comments:    Patient quit 2005  Vaping Use   Vaping status: Never Used  Substance and Sexual Activity   Alcohol use: No    Alcohol/week: 0.0 standard drinks of alcohol   Drug use: No   Sexual activity: Yes    Partners: Male    Birth control/protection: Pill  Other Topics Concern   Not on file  Social History Narrative   Not on file   Social Drivers of Health   Financial Resource Strain: Not on file  Food Insecurity: Not on file  Transportation Needs: Not on file  Physical Activity: Not on file  Stress: Not on file  Social Connections: Unknown (09/06/2021)   [facility], [facility]   Social Network    Social Network: Not on file  Intimate Partner Violence: Unknown (08/09/2021)   Received from [employer], [facility]   HITS    Physically Hurt: Not on file    Insult or Talk Down To: Not on file    Threaten Physical Harm: Not on file    Scream or Curse: Not on file   Family Status  Relation Name Status   Father  (Not Specified)   Mother  (Not Specified)   MGF  (Not Specified)   PGF  (Not Specified)  No partnership data on file  Family History  Problem Relation Age of Onset   Depression Father    Cancer Mother        PANCREATIC   Diabetes Maternal Grandfather    Stroke Maternal Grandfather    Diabetes Paternal Grandfather    Allergies  Allergen Reactions   Sumatriptan Other (See Comments) and Nausea Only    Visual changes and severe headache      Medications: Outpatient Medications Prior to Visit  Medication Sig   busPIRone (BUSPAR) 5 MG tablet Take 1-2 tablets (5-10 mg total) by mouth 2 (two) times daily. Needs appointment.   Clobetasol Propionate 0.05 % lotion Apply topically as needed.   HUMIRA 40 MG/0.4ML PSKT Inject 80 mg as directed.   sertraline (ZOLOFT) 100 MG tablet Take 1.5 tablets (150 mg total) by mouth daily.   [DISCONTINUED] azithromycin (ZITHROMAX) 250 MG tablet    [DISCONTINUED] ibuprofen  (ADVIL) 600 MG tablet    [DISCONTINUED] meloxicam (MOBIC) 15 MG tablet One tab PO qAM with a meal for 2 weeks, then daily prn pain.   No facility-administered medications prior to visit.    Allergies  Allergen Reactions   Sumatriptan Other (See Comments) and Nausea Only    Visual changes and severe headache    Patient Care Team: Christen Butter, NP as PCP - General (Nurse Practitioner)  Review of Systems  Constitutional: Negative.  Negative for chills, diaphoresis, fever, malaise/fatigue and weight loss.  HENT: Negative.  Negative for congestion, ear discharge, ear pain, hearing loss, nosebleeds, sinus pain, sore throat and tinnitus.   Eyes: Negative.  Negative for blurred vision, double vision, photophobia, pain, discharge and redness.  Respiratory: Negative.  Negative for cough, hemoptysis, sputum production, shortness of breath, wheezing and stridor.   Cardiovascular: Negative.  Negative for chest pain, palpitations, orthopnea, claudication, leg swelling and PND.  Gastrointestinal: Negative.  Negative for abdominal pain, blood in stool, constipation, diarrhea, heartburn, melena, nausea and vomiting.  Genitourinary: Negative.  Negative for dysuria, flank pain, frequency, hematuria and urgency.  Musculoskeletal: Negative.  Negative for back pain, falls, joint pain, myalgias and neck pain.  Skin: Negative.  Negative for itching and rash.  Neurological: Negative.  Negative for dizziness, tingling, tremors, sensory change, speech change, focal weakness, seizures, loss of consciousness, weakness and headaches.  Endo/Heme/Allergies: Negative.  Negative for environmental allergies and polydipsia. Does not bruise/bleed easily.  Psychiatric/Behavioral:  Positive for depression. Negative for hallucinations, memory loss, substance abuse and suicidal ideas. The patient is nervous/anxious. The patient does not have insomnia.        Anxiety and depression well managed. Has no desire to change regimen at  this time.      Objective  BP (!) 100/59 (BP Location: Left Arm, Cuff Size: Normal)   Pulse 64   Resp 20   Ht 5\' 4"  (1.626 m)   Wt 131 lb 4.8 oz (59.6 kg)   SpO2 100%   BMI 22.54 kg/m  BP Readings from Last 3 Encounters:  06/03/23 (!) 100/59  12/13/22 (!) 106/59  11/27/21 112/80      Physical Exam Vitals and nursing note reviewed.  Constitutional:      General: She is awake.     Appearance: Normal appearance. She is well-developed and normal weight.  HENT:     Head: Normocephalic and atraumatic.     Right Ear: Tympanic membrane, ear canal and external ear normal.     Nose: Nose normal.     Mouth/Throat:     Mouth: Mucous membranes are moist.  Pharynx: Oropharynx is clear.  Eyes:     Extraocular Movements: Extraocular movements intact.     Conjunctiva/sclera: Conjunctivae normal.     Pupils: Pupils are equal, round, and reactive to light.  Neck:     Thyroid: No thyroid mass, thyromegaly or thyroid tenderness.  Cardiovascular:     Rate and Rhythm: Normal rate and regular rhythm.     Pulses: Normal pulses.     Heart sounds: Normal heart sounds.  Pulmonary:     Effort: Pulmonary effort is normal. No respiratory distress.     Breath sounds: Normal breath sounds. No stridor. No wheezing, rhonchi or rales.  Abdominal:     General: Abdomen is flat. Bowel sounds are normal. There is no distension.     Palpations: Abdomen is soft.     Tenderness: There is no abdominal tenderness. There is no guarding.  Musculoskeletal:        General: No swelling, tenderness, deformity or signs of injury. Normal range of motion.     Cervical back: Normal range of motion. No edema or signs of trauma. No pain with movement. Normal range of motion.     Right lower leg: No edema.     Left lower leg: No edema.  Lymphadenopathy:     Head:     Right side of head: No submental, submandibular, tonsillar, preauricular, posterior auricular or occipital adenopathy.     Left side of head: No  submental, submandibular, tonsillar, preauricular, posterior auricular or occipital adenopathy.     Cervical: No cervical adenopathy.  Skin:    General: Skin is warm and dry.     Capillary Refill: Capillary refill takes less than 2 seconds.  Neurological:     General: No focal deficit present.     Mental Status: She is alert and oriented to person, place, and time.     Cranial Nerves: No cranial nerve deficit.     Sensory: No sensory deficit.     Motor: No weakness.     Coordination: Coordination normal.     Gait: Gait normal.     Deep Tendon Reflexes: Reflexes normal.  Psychiatric:        Mood and Affect: Mood normal.        Behavior: Behavior is cooperative.        Thought Content: Thought content normal.        Judgment: Judgment normal.     Most recent fall risk assessment:    06/03/2023    2:45 PM  Fall Risk   Falls in the past year? 0  Number falls in past yr: 0  Injury with Fall? 0  Risk for fall due to : No Fall Risks  Follow up Falls evaluation completed    Most recent depression screenings:    12/13/2022    3:03 PM 04/17/2021   10:05 AM  PHQ 2/9 Scores  PHQ - 2 Score 3 0  PHQ- 9 Score 8    Most recent cognitive screening:     No data to display         Most recent Audit-C alcohol use screening     No data to display         A score of 3 or more in women, and 4 or more in men indicates increased risk for alcohol abuse, EXCEPT if all of the points are from question 1   Vision/Hearing Screen: No results found.  Last CBC Lab Results  Component Value Date   WBC 9.4  06/07/2020   HGB 14.0 06/07/2020   HCT 42.0 06/07/2020   MCV 88.1 06/07/2020   MCH 29.4 06/07/2020   RDW 12.0 06/07/2020   PLT 321 06/07/2020   Last metabolic panel Lab Results  Component Value Date   GLUCOSE 68 06/07/2020   NA 141 06/07/2020   K 4.0 06/07/2020   CL 105 06/07/2020   CO2 30 06/07/2020   BUN 12 06/07/2020   CREATININE 0.74 06/07/2020   GFRNONAA 106  06/07/2020   CALCIUM 9.3 06/07/2020   PROT 6.6 06/07/2020   BILITOT 0.6 06/07/2020   AST 15 06/07/2020   ALT 11 06/07/2020   Last lipids Lab Results  Component Value Date   CHOL 158 08/27/2019   HDL 44 (L) 08/27/2019   LDLCALC 100 (H) 08/27/2019   TRIG 49 08/27/2019   CHOLHDL 3.6 08/27/2019   Last thyroid functions Lab Results  Component Value Date   TSH 0.93 06/07/2020   T4TOTAL 4.6 05/26/2015      Assessment & Plan   Annual wellness visit done today including the all of the following: Reviewed patient's Family Medical History Reviewed and updated list of patient's medical providers Assessment of cognitive impairment was done Assessed patient's functional ability Established a written schedule for health screening services Health Risk Assessent Completed and Reviewed  Exercise Activities and Dietary recommendations  Goals   None     Immunization History  Administered Date(s) Administered   Hep B, Unspecified 08/04/1993   Hepatitis A, Adult 09/03/1993   Hepatitis B 08/04/1993   Influenza Inj Mdck Quad Pf 02/17/2018   Influenza, Seasonal, Injecte, Preservative Fre 02/17/2018, 02/03/2019   Influenza,inj,Quad PF,6+ Mos 05/30/2016, 06/07/2020, 04/17/2021   Influenza-Unspecified 10/05/2014, 02/17/2018, 02/03/2019   PFIZER(Purple Top)SARS-COV-2 Vaccination 08/27/2019, 09/17/2019   Tdap 10/05/2014, 06/10/2018    Health Maintenance  Topic Date Due   HIV Screening  Never done   Hepatitis C Screening  Never done   Cervical Cancer Screening (HPV/Pap Cotest)  Never done   INFLUENZA VACCINE  12/05/2022   COVID-19 Vaccine (3 - 2024-25 season) 01/05/2023   DTaP/Tdap/Td (3 - Td or Tdap) 06/10/2028   HPV VACCINES  Aged Out     Discussed health benefits of physical activity, and encouraged her to engage in regular exercise appropriate for her age and condition.    Problem List Items Addressed This Visit    1. Annual physical exam (Primary) Influenza given - Lipid  panel - CBC with Differential/Platelet - CMP14+EGFR  2. Anxiety and depression No changes to buspirone and sertraline regimen  3. Hyperthyroidism Will review lab results and notify if changes or intervention needed - TSH  4. Need for influenza vaccination Given influenza - Flu vaccine trivalent PF, 6mos and older(Flulaval,Afluria,Fluarix,Fluzone)  5. Encounter for other contraceptive management - NORA-BE 0.35 MG tablet; Take 1 tablet (0.35 mg total) by mouth daily.  Dispense: 84 tablet; Refill: 4  - Urine Pregnancy test    Return in about 1 year (around 06/02/2024) for annual physical exam.    Loma Sousa, RN Student AGNP

## 2023-06-03 NOTE — Progress Notes (Signed)
Medical screening examination/treatment was performed by qualified clinical staff member and as supervising provider I was immediately available for consultation/collaboration. I have reviewed documentation and agree with assessment and plan.  Addendum: Urine pregnancy test negative today.  Restarting on birth control per patient request.  She has been on this for several years with brief interruptions in therapy while pregnant.  Tolerated the medication well and had no significant side effects.  Plan for follow-up in 1 year for surveillance of oral contraceptives.  Thayer Ohm, DNP, APRN, FNP-BC [facility] and Sports Medicine

## 2023-06-04 ENCOUNTER — Encounter: Payer: Self-pay | Admitting: Medical-Surgical

## 2023-06-04 LAB — LIPID PANEL
Chol/HDL Ratio: 3.5 {ratio} (ref 0.0–4.4)
Cholesterol, Total: 164 mg/dL (ref 100–199)
HDL: 47 mg/dL (ref >39)
LDL Chol Calc (NIH): 106 mg/dL — ABNORMAL HIGH (ref 0–99)
Triglycerides: 56 mg/dL (ref 0–149)
VLDL Cholesterol Cal: 11 mg/dL (ref 5–40)

## 2023-06-04 LAB — CBC WITH DIFFERENTIAL/PLATELET
Basophils Absolute: 0.1 10*3/uL (ref 0.0–0.2)
Basos: 1 %
EOS (ABSOLUTE): 0.1 10*3/uL (ref 0.0–0.4)
Eos: 1 %
Hematocrit: 41.8 % (ref 34.0–46.6)
Hemoglobin: 13.9 g/dL (ref 11.1–15.9)
Immature Grans (Abs): 0.1 10*3/uL (ref 0.0–0.1)
Immature Granulocytes: 1 %
Lymphocytes Absolute: 2.3 10*3/uL (ref 0.7–3.1)
Lymphs: 27 %
MCH: 30 pg (ref 26.6–33.0)
MCHC: 33.3 g/dL (ref 31.5–35.7)
MCV: 90 fL (ref 79–97)
Monocytes Absolute: 0.7 10*3/uL (ref 0.1–0.9)
Monocytes: 8 %
Neutrophils Absolute: 5.5 10*3/uL (ref 1.4–7.0)
Neutrophils: 62 %
Platelets: 340 10*3/uL (ref 150–450)
RBC: 4.64 x10E6/uL (ref 3.77–5.28)
RDW: 11.8 % (ref 11.7–15.4)
WBC: 8.6 10*3/uL (ref 3.4–10.8)

## 2023-06-04 LAB — CMP14+EGFR
ALT: 18 [IU]/L (ref 0–32)
AST: 26 [IU]/L (ref 0–40)
Albumin: 4.1 g/dL (ref 3.9–4.9)
Alkaline Phosphatase: 50 [IU]/L (ref 44–121)
BUN/Creatinine Ratio: 9 (ref 9–23)
BUN: 7 mg/dL (ref 6–20)
Bilirubin Total: 0.4 mg/dL (ref 0.0–1.2)
CO2: 23 mmol/L (ref 20–29)
Calcium: 8.9 mg/dL (ref 8.7–10.2)
Chloride: 103 mmol/L (ref 96–106)
Creatinine, Ser: 0.75 mg/dL (ref 0.57–1.00)
Globulin, Total: 2.8 g/dL (ref 1.5–4.5)
Glucose: 85 mg/dL (ref 70–99)
Potassium: 3.8 mmol/L (ref 3.5–5.2)
Sodium: 140 mmol/L (ref 134–144)
Total Protein: 6.9 g/dL (ref 6.0–8.5)
eGFR: 106 mL/min/{1.73_m2} (ref >59)

## 2023-06-04 LAB — TSH: TSH: 0.751 u[IU]/mL (ref 0.450–4.500)

## 2023-06-11 DIAGNOSIS — F411 Generalized anxiety disorder: Secondary | ICD-10-CM | POA: Diagnosis not present

## 2023-06-16 NOTE — Telephone Encounter (Signed)
 Ok. No problem. Thanks for the update.

## 2023-06-16 NOTE — Telephone Encounter (Signed)
 Spoke with patient - informed that appt was for a PAP smear-  She asked to cancel this appointment stating that someone will be with her that she would feel uncomfortable doing a pap.  She will call back to reschedule thsi.

## 2023-06-23 ENCOUNTER — Ambulatory Visit: Payer: BC Managed Care – PPO | Admitting: Medical-Surgical

## 2023-06-24 DIAGNOSIS — F411 Generalized anxiety disorder: Secondary | ICD-10-CM | POA: Diagnosis not present

## 2023-06-30 DIAGNOSIS — L400 Psoriasis vulgaris: Secondary | ICD-10-CM | POA: Diagnosis not present

## 2023-07-08 DIAGNOSIS — L400 Psoriasis vulgaris: Secondary | ICD-10-CM | POA: Diagnosis not present

## 2023-07-08 DIAGNOSIS — F411 Generalized anxiety disorder: Secondary | ICD-10-CM | POA: Diagnosis not present

## 2023-07-08 DIAGNOSIS — R531 Weakness: Secondary | ICD-10-CM | POA: Diagnosis not present

## 2023-07-08 DIAGNOSIS — Z111 Encounter for screening for respiratory tuberculosis: Secondary | ICD-10-CM | POA: Diagnosis not present

## 2023-07-08 DIAGNOSIS — Z79899 Other long term (current) drug therapy: Secondary | ICD-10-CM | POA: Diagnosis not present

## 2023-07-23 DIAGNOSIS — L400 Psoriasis vulgaris: Secondary | ICD-10-CM | POA: Diagnosis not present

## 2023-07-24 DIAGNOSIS — F411 Generalized anxiety disorder: Secondary | ICD-10-CM | POA: Diagnosis not present

## 2023-07-31 DIAGNOSIS — F411 Generalized anxiety disorder: Secondary | ICD-10-CM | POA: Diagnosis not present

## 2023-08-25 DIAGNOSIS — F411 Generalized anxiety disorder: Secondary | ICD-10-CM | POA: Diagnosis not present

## 2023-08-26 ENCOUNTER — Encounter: Payer: Self-pay | Admitting: Medical-Surgical

## 2023-08-26 ENCOUNTER — Ambulatory Visit: Admitting: Medical-Surgical

## 2023-08-26 VITALS — BP 100/65 | HR 57 | Resp 20 | Ht 64.0 in | Wt 131.1 lb

## 2023-08-26 DIAGNOSIS — F419 Anxiety disorder, unspecified: Secondary | ICD-10-CM | POA: Diagnosis not present

## 2023-08-26 DIAGNOSIS — G4726 Circadian rhythm sleep disorder, shift work type: Secondary | ICD-10-CM | POA: Diagnosis not present

## 2023-08-26 DIAGNOSIS — F32A Depression, unspecified: Secondary | ICD-10-CM | POA: Diagnosis not present

## 2023-08-26 MED ORDER — HYDROXYZINE PAMOATE 25 MG PO CAPS: 25.0000 mg | ORAL_CAPSULE | Freq: Every evening | ORAL | 1 refills | Status: DC | PRN

## 2023-08-26 MED ORDER — ESCITALOPRAM OXALATE 10 MG PO TABS: ORAL_TABLET | ORAL | 1 refills | Status: DC

## 2023-08-26 NOTE — Progress Notes (Signed)
        Established patient visit  History, exam, impression, and plan:  1. Anxiety and depression (Primary) Pleasant 38 year old female presenting today to discuss mood altering medications.  She was previously prescribed sertraline  as well as BuSpar .  Has stopped both of these medications on her own because she did not like the way they made her feel.  Notes that they made her feel like her emotions were completely disconnected.  Once she stopped them, she has been able to experience emotions and process them accordingly.  Reports that the sertraline  seem to be the biggest problem.  She does have BuSpar  at home for as needed use.  Is interested in medications that may help with management of her depression and anxiety symptoms but does not want to feel like a zombie.  In addition to regular everyday stress, she notes that she and her husband have separated which has been a significant change.  Denies SI/HI.  Reviewed options for treatment of anxiety and depression for maintenance as well as as needed.  Plan to start Lexapro  5 mg daily for 1 week then increase to 10 mg daily.  Okay to use BuSpar  for as needed anxiety although this medication does work better when taken twice daily scheduled.  Plan to follow-up closely in 4-6 weeks to evaluate tolerance and response.  2. 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  She has not taken sleep medications before but is interested in something that may help.  Discussed various options.  With her propensity for being unable to shut her mind down, feel that hydroxyzine  would be a good option.  We will start with hydroxyzine  25-50 mg before bed as needed to help with sleep.  If this is unhelpful, we can increase it up to 100 mg before bed.  Alternatively, we can switch to trazodone which may be a little more helpful in the  long run given the issues with anxiety and depression.  Patient to send me a message via MyChart if she decides to switch before our next appointment.   Procedures performed this visit: None.  Return in about 6 weeks (around 10/07/2023) for mood follow up.  __________________________________ Maryl Snook, DNP, APRN, FNP-BC Primary Care and Sports Medicine [facility] [place]

## 2023-09-02 DIAGNOSIS — F411 Generalized anxiety disorder: Secondary | ICD-10-CM | POA: Diagnosis not present

## 2023-09-17 ENCOUNTER — Other Ambulatory Visit: Payer: Self-pay | Admitting: Medical-Surgical

## 2023-09-25 ENCOUNTER — Encounter: Payer: Self-pay | Admitting: Medical-Surgical

## 2023-10-06 ENCOUNTER — Encounter: Payer: Self-pay | Admitting: Medical-Surgical

## 2023-10-06 ENCOUNTER — Ambulatory Visit: Admitting: Medical-Surgical

## 2023-10-06 VITALS — BP 116/69 | HR 58 | Resp 20 | Ht 64.0 in | Wt 130.1 lb

## 2023-10-06 DIAGNOSIS — F419 Anxiety disorder, unspecified: Secondary | ICD-10-CM | POA: Diagnosis not present

## 2023-10-06 DIAGNOSIS — F32A Depression, unspecified: Secondary | ICD-10-CM

## 2023-10-06 DIAGNOSIS — F5231 Female orgasmic disorder: Secondary | ICD-10-CM | POA: Diagnosis not present

## 2023-10-06 MED ORDER — ESCITALOPRAM OXALATE 10 MG PO TABS: ORAL_TABLET | ORAL | 1 refills | Status: DC

## 2023-10-06 MED ORDER — HYDROXYZINE PAMOATE 25 MG PO CAPS: 25.0000 mg | ORAL_CAPSULE | Freq: Every evening | ORAL | 1 refills | Status: AC | PRN

## 2023-10-06 NOTE — Progress Notes (Signed)
        Established patient visit  History, exam, impression, and plan:  1. Anxiety and depression (Primary) Pleasant 38 year old female presenting today for follow-up on anxiety and depression.  She has been taking Lexapro  10 mg daily and using hydroxyzine  25-50 mg at bedtime as needed.  Feels that these medications are working well for her to control her mood and help her get sleep at night.  Tolerating both medications without side effects.  Denies SI/HI.  She is happy with her current regimen so continue Lexapro  and hydroxyzine  as prescribed.  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  Reports that she has only had an orgasm during penetrative intercourse once in her life.  She is able to orgasm with masturbation only when she uses sexual toys.  Feels like she is getting pleasure from the action however at some point she "hits a wall" and is unable to get past it.  Thinks that she may be getting stuck in her head at times.  She is open and honest with her sexual partners and they are aware that she cannot orgasm during intercourse.  Her partners are open to trying new things in hopes that it will help however nothing seems to be working.  She is interested in options for treatment.  Labs have been unremarkable in the past so do not suspect hormonal imbalance.  Discussed anorgasmia in females.  As she is able to orgasm during masturbation, feel that her issue is more secondary in nature.  Discussed first-line recommendations for behavioral health referral for sex therapy.  She is agreeable so referral placed today.  Procedures performed this visit: None.  Return in about 6 months (around 04/06/2024) for mood follow up.  __________________________________ Maryl Snook, DNP, APRN, FNP-BC Primary Care and Sports Medicine [facility] [place]

## 2023-10-08 ENCOUNTER — Other Ambulatory Visit: Payer: Self-pay | Admitting: Medical-Surgical

## 2023-10-08 DIAGNOSIS — F5231 Female orgasmic disorder: Secondary | ICD-10-CM

## 2023-10-14 DIAGNOSIS — F411 Generalized anxiety disorder: Secondary | ICD-10-CM | POA: Diagnosis not present

## 2023-10-22 DIAGNOSIS — F411 Generalized anxiety disorder: Secondary | ICD-10-CM | POA: Diagnosis not present

## 2023-12-29 ENCOUNTER — Ambulatory Visit: Payer: Self-pay

## 2023-12-29 NOTE — Telephone Encounter (Signed)
 FYI Only or Action Required?:   Patient was last seen in primary care on 10/06/2023 by Willo Mini, NP.  Called Nurse Triage reporting Urinary Frequency.  Symptoms began a week ago.  Interventions attempted: Rest, hydration, or home remedies.  Symptoms are: gradually worsening.  Triage Disposition: See Physician Within 24 Hours  Patient/caregiver understands and will follow disposition?: Unsure   Message from [place] sent at 12/29/2023  8:56 AM EDT  Summary: Question UTI   Frequent urination, burning , started last night, Question UTI ...    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 SYMPTOM: What's the main symptom you're concerned about? (e.g., frequency, incontinence)     Frequency, urgency  2. ONSET: When did the  uri sx  start?     One week 3. PAIN: Is there any pain? If Yes, ask: How bad is it? (Scale: 1-10; mild, moderate, severe)     burning 4. CAUSE: What do you think is causing the symptoms?     uti 5. OTHER SYMPTOMS: Do you have any other symptoms? (e.g., blood in urine, fever, flank pain, pain with urination)     Denies    Additional info: Patient declines offered acute visit today and offered acute visit 12/30/23 due to work training. She plans on attending urgent care after work today.  Protocols used: Urinary Symptoms-A-AH, Urination Pain - Female-A-AH

## 2024-01-06 ENCOUNTER — Encounter: Payer: Self-pay | Admitting: Sports Medicine

## 2024-01-19 ENCOUNTER — Telehealth: Payer: Self-pay | Admitting: Medical-Surgical

## 2024-01-19 NOTE — Telephone Encounter (Signed)
 [facility] called and stated that they do not have a provider that specializes in sex therapy but they do have one that is willing to see the patient. Did you have any specifications on this referral?  Camie [phone]

## 2024-01-21 NOTE — Telephone Encounter (Signed)
 Talked to [place] from [place]. She stated although she has a therapist willing to see her for the anxiety and depression, the therapist stated if the patient brought up sex therapy then she could not help her at all. Camie suggested we find another office for the patient based on the type of treatment needed.   Can we send the referral to a place that actually specializes in Sex therapy, please? Patient is okay with going elsewhere.

## 2024-04-06 ENCOUNTER — Ambulatory Visit: Admitting: Medical-Surgical

## 2024-04-06 ENCOUNTER — Encounter: Payer: Self-pay | Admitting: Medical-Surgical

## 2024-04-06 VITALS — BP 102/63 | HR 63 | Resp 20 | Ht 64.0 in | Wt 133.0 lb

## 2024-04-06 DIAGNOSIS — F5231 Female orgasmic disorder: Secondary | ICD-10-CM | POA: Diagnosis not present

## 2024-04-06 DIAGNOSIS — Z308 Encounter for other contraceptive management: Secondary | ICD-10-CM | POA: Diagnosis not present

## 2024-04-06 DIAGNOSIS — R41840 Attention and concentration deficit: Secondary | ICD-10-CM | POA: Diagnosis not present

## 2024-04-06 DIAGNOSIS — F32A Depression, unspecified: Secondary | ICD-10-CM

## 2024-04-06 DIAGNOSIS — F419 Anxiety disorder, unspecified: Secondary | ICD-10-CM

## 2024-04-06 MED ORDER — NORA-BE 0.35 MG PO TABS: 1.0000 | ORAL_TABLET | Freq: Every day | ORAL | 4 refills | Status: AC

## 2024-04-06 NOTE — Progress Notes (Signed)
        Established patient visit   History of Present Illness   Discussed the use of AI scribe software for clinical note transcription with the patient, who gave verbal consent to proceed.  History of Present Illness   Brittany Mcknight is a 38 year old female who presents with difficulty reaching orgasm.  Anorgasmia - Difficulty reaching orgasm with a partner, having only achieved orgasm once, attributed to chance - Able to reach orgasm when using a device alone  Mood disturbance - Prescribed Lexapro  for mood symptoms - Struggles with medication adherence, very little consistent dosing - Uses hydroxyzine  as needed for anxiety when she remembers - Attends counseling once or twice a month - Finds therapy beneficial but expresses concern about cost - Therapist has suggested evaluation for ADHD  Contraceptive use - Uses Nora-Be 1 tablet daily without issues     Physical Exam   Physical Exam Vitals reviewed.  Constitutional:      General: She is not in acute distress.    Appearance: Normal appearance. She is not ill-appearing.  HENT:     Head: Normocephalic and atraumatic.  Cardiovascular:     Rate and Rhythm: Normal rate and regular rhythm.  Pulmonary:     Effort: Pulmonary effort is normal. No respiratory distress.  Skin:    General: Skin is warm and dry.  Neurological:     Mental Status: She is alert and oriented to person, place, and time.  Psychiatric:        Mood and Affect: Mood normal.        Behavior: Behavior normal.        Thought Content: Thought content normal.        Judgment: Judgment normal.    Assessment & Plan     Anxiety with depression Prescribed Lexapro  but very inconsistent dosing. Using hydroxyzine  for anxiety when she remembers/prn. Finds counseling helpful. - Discontinue Lexapro . - Offered medication alternatives, declined today.  - Continue hydroxyzine  as needed.  - Continue counseling as directed.   Anorgasmia of female Difficulty  reaching orgasm with a partner, possibly related to Lexapro  use. Limited pharmacological options for premenopausal women. Prefers to avoid invasive procedures. - Prior referral to CBT for sex therapy not pursued due to cost and already has a therapist. 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 - Will research alternative treatments and communicate findings via MyChart.  Inattention Therapist suggested ADHD evaluation due to symptoms. Prefers referral to psychology for testing. Discussed alternative option of online testing.  - Referred to psychology for ADHD evaluation.  Contraceptive management Currently using Nora-Be effectively, well tolerated. - Refilled birth control.     Follow up   Return if symptoms worsen or fail to improve. __________________________________ Zada FREDRIK Palin, DNP, APRN, FNP-BC Primary Care and Sports Medicine [facility] [address]

## 2024-06-02 ENCOUNTER — Encounter: Payer: BC Managed Care – PPO | Admitting: Medical-Surgical
